# Patient Record
Sex: Female | Born: 1995 | Race: White | Hispanic: No | Marital: Married | Smoking: Never smoker
Health system: Southern US, Community
[De-identification: ages and names within clinical notes are randomized; demographics above are authoritative.]

## PROBLEM LIST (undated history)

## (undated) ENCOUNTER — Inpatient Hospital Stay (HOSPITAL_COMMUNITY): Payer: Self-pay

## (undated) DIAGNOSIS — F32A Depression, unspecified: Secondary | ICD-10-CM

## (undated) DIAGNOSIS — R55 Syncope and collapse: Secondary | ICD-10-CM

## (undated) DIAGNOSIS — N10 Acute tubulo-interstitial nephritis: Secondary | ICD-10-CM

## (undated) DIAGNOSIS — F419 Anxiety disorder, unspecified: Secondary | ICD-10-CM

## (undated) DIAGNOSIS — F329 Major depressive disorder, single episode, unspecified: Secondary | ICD-10-CM

## (undated) HISTORY — DX: Major depressive disorder, single episode, unspecified: F32.9

## (undated) HISTORY — DX: Depression, unspecified: F32.A

## (undated) HISTORY — DX: Anxiety disorder, unspecified: F41.9

## (undated) HISTORY — PX: FRACTURE SURGERY: SHX138

---

## 2017-10-31 ENCOUNTER — Emergency Department (HOSPITAL_COMMUNITY)
Admission: EM | Admit: 2017-10-31 | Discharge: 2017-10-31 | Disposition: A | Discharge status: Home / Self Care | Payer: Self-pay | Attending: Emergency Medicine | Admitting: Emergency Medicine

## 2017-10-31 ENCOUNTER — Encounter (HOSPITAL_COMMUNITY): Payer: Self-pay | Admitting: Emergency Medicine

## 2017-10-31 DIAGNOSIS — R110 Nausea: Secondary | ICD-10-CM | POA: Insufficient documentation

## 2017-10-31 DIAGNOSIS — O26891 Other specified pregnancy related conditions, first trimester: Secondary | ICD-10-CM | POA: Insufficient documentation

## 2017-10-31 DIAGNOSIS — R109 Unspecified abdominal pain: Secondary | ICD-10-CM | POA: Insufficient documentation

## 2017-10-31 DIAGNOSIS — Z3A01 Less than 8 weeks gestation of pregnancy: Secondary | ICD-10-CM | POA: Insufficient documentation

## 2017-10-31 LAB — URINALYSIS, ROUTINE W REFLEX MICROSCOPIC
Bilirubin Urine: NEGATIVE
GLUCOSE, UA: NEGATIVE mg/dL
HGB URINE DIPSTICK: NEGATIVE
Ketones, ur: 20 mg/dL — AB
LEUKOCYTES UA: NEGATIVE
Nitrite: NEGATIVE
PH: 5 (ref 5.0–8.0)
PROTEIN: NEGATIVE mg/dL
SPECIFIC GRAVITY, URINE: 1.016 (ref 1.005–1.030)

## 2017-10-31 LAB — COMPREHENSIVE METABOLIC PANEL
ALT: 10 U/L — AB (ref 14–54)
AST: 15 U/L (ref 15–41)
Albumin: 3.8 g/dL (ref 3.5–5.0)
Alkaline Phosphatase: 53 U/L (ref 38–126)
Anion gap: 6 (ref 5–15)
BUN: 8 mg/dL (ref 6–20)
CHLORIDE: 107 mmol/L (ref 101–111)
CO2: 24 mmol/L (ref 22–32)
CREATININE: 0.57 mg/dL (ref 0.44–1.00)
Calcium: 8.7 mg/dL — ABNORMAL LOW (ref 8.9–10.3)
GFR calc non Af Amer: >60 mL/min (ref >60)
Glucose, Bld: 85 mg/dL (ref 65–99)
POTASSIUM: 3.2 mmol/L — AB (ref 3.5–5.1)
SODIUM: 137 mmol/L (ref 135–145)
Total Bilirubin: 0.7 mg/dL (ref 0.3–1.2)
Total Protein: 6.5 g/dL (ref 6.5–8.1)

## 2017-10-31 LAB — I-STAT BETA HCG BLOOD, ED (MC, WL, AP ONLY): I-stat hCG, quantitative: 245.6 m[IU]/mL — ABNORMAL HIGH (ref <5)

## 2017-10-31 LAB — CBC
HCT: 31.6 % — ABNORMAL LOW (ref 36.0–46.0)
Hemoglobin: 10.6 g/dL — ABNORMAL LOW (ref 12.0–15.0)
MCH: 28.3 pg (ref 26.0–34.0)
MCHC: 33.5 g/dL (ref 30.0–36.0)
MCV: 84.5 fL (ref 78.0–100.0)
PLATELETS: 254 10*3/uL (ref 150–400)
RBC: 3.74 MIL/uL — AB (ref 3.87–5.11)
RDW: 12.1 % (ref 11.5–15.5)
WBC: 7.4 10*3/uL (ref 4.0–10.5)

## 2017-10-31 LAB — LIPASE, BLOOD: LIPASE: 23 U/L (ref 11–51)

## 2017-10-31 MED ORDER — PRENATAL VITAMIN 27-0.8 MG PO TABS: 1.0000 | ORAL_TABLET | Freq: Every day | ORAL | 0 refills | Status: DC

## 2017-10-31 MED ORDER — ONDANSETRON 4 MG PO TBDP: 4.0000 mg | ORAL_TABLET | Freq: Three times a day (TID) | ORAL | 0 refills | Status: DC | PRN

## 2017-10-31 NOTE — ED Notes (Signed)
Unable to obtain signature, signature pad does not work.

## 2017-10-31 NOTE — ED Notes (Signed)
Pt had drawn for labs:  Gold Red  Blue Lavender Lt green dk green

## 2017-10-31 NOTE — ED Triage Notes (Signed)
Patient c/o abd pain, lower back pain that has been going on for couple days with nausea. Today patient reports that she vomited once with darker blood in it.

## 2017-10-31 NOTE — ED Notes (Signed)
Pt verbalizes understanding of d/c paperwork, follow up instructions, and medications. Pt A/O x4, ambulatory. All belongings with patient upon departure.  

## 2017-10-31 NOTE — ED Provider Notes (Signed)
[facility]-EMERGENCY DEPT Provider Note   CSN: [id number] Arrival date & time: 10/31/17  1707     History   Chief Complaint Chief Complaint  Patient presents with  . Abdominal Pain  . Back Pain  . Hematemesis    HPI Angela Hardin is a 21 y.o. female.  Pt presents to ED today with abdominal pain and lower back pain with nausea.  The pt said that she felt this way when she was pregnant with her son.  The pt has felt very emotional too.  The pt denies any pain now.      Past Medical History:  Diagnosis Date  . Renal disorder     There are no active problems to display for this patient.   Past Surgical History:  Procedure Laterality Date  . FRACTURE SURGERY      OB History    No data available       Home Medications    Prior to Admission medications   Medication Sig Start Date End Date Taking? Authorizing Provider  ondansetron (ZOFRAN ODT) 4 MG disintegrating tablet Take 1 tablet (4 mg total) by mouth every 8 (eight) hours as needed. 10/31/17   Jacalyn LefevreHaviland, Junie Engram, MD  Prenatal Vit-Fe Fumarate-FA (PRENATAL VITAMIN) 27-0.8 MG TABS Take 1 capsule by mouth daily. 10/31/17   Jacalyn LefevreHaviland, Donnarae Rae, MD    Family History No family history on file.  Social History Social History   Tobacco Use  . Smoking status: Not on file  . Smokeless tobacco: Never Used  Substance Use Topics  . Alcohol use: Not on file  . Drug use: Not on file     Allergies   Patient has no known allergies.   Review of Systems Review of Systems  Gastrointestinal: Positive for abdominal pain and nausea.  All other systems reviewed and are negative.    Physical Exam Updated Vital Signs BP 117/64 (BP Location: Left Arm)   Pulse 95   Temp 98.3 F (36.8 C) (Oral)   Resp 18   Ht 5\' 5"  (1.651 m)   Wt 47.6 kg (105 lb)   LMP  (LMP Unknown)   SpO2 100%   BMI 17.47 kg/m   Physical Exam  Constitutional: She is oriented to person, place, and time. She appears  well-developed and well-nourished.  HENT:  Head: Normocephalic and atraumatic.  Mouth/Throat: Oropharynx is clear and moist.  Eyes: EOM are normal. Pupils are equal, round, and reactive to light.  Cardiovascular: Normal rate, regular rhythm, normal heart sounds and intact distal pulses.  Pulmonary/Chest: Effort normal and breath sounds normal.  Abdominal: Normal appearance and bowel sounds are normal. There is no tenderness.  Neurological: She is alert and oriented to person, place, and time.  Skin: Skin is warm. Capillary refill takes less than 2 seconds.  Psychiatric: She has a normal mood and affect.  Nursing note and vitals reviewed.    ED Treatments / Results  Labs (all labs ordered are listed, but only abnormal results are displayed) Labs Reviewed  COMPREHENSIVE METABOLIC PANEL - Abnormal; Notable for the following components:      Result Value   Potassium 3.2 (*)    Calcium 8.7 (*)    ALT 10 (*)    All other components within normal limits  CBC - Abnormal; Notable for the following components:   RBC 3.74 (*)    Hemoglobin 10.6 (*)    HCT 31.6 (*)    All other components within normal limits  URINALYSIS, ROUTINE  W REFLEX MICROSCOPIC - Abnormal; Notable for the following components:   Ketones, ur 20 (*)    All other components within normal limits  I-STAT BETA HCG BLOOD, ED ([facility], [facility], [facility] ONLY) - Abnormal; Notable for the following components:   I-stat hCG, quantitative 245.6 (*)    All other components within normal limits  LIPASE, BLOOD    EKG  EKG Interpretation None       Radiology No results found.  Procedures Procedures (including critical care time)  Medications Ordered in ED Medications - No data to display   Initial Impression / Assessment and Plan / ED Course  I have reviewed the triage vital signs and the nursing notes.  Pertinent labs & imaging results that were available during my care of the patient were reviewed by me and considered in my  medical decision making (see chart for details).    Preg test +.  Labs otherwise ok.  No current abd pain.  Pt instructed to f/u with women's clinic and return if worse.  Final Clinical Impressions(s) / ED Diagnoses   Final diagnoses:  Less than [redacted] weeks gestation of pregnancy    ED Discharge Orders        Ordered    ondansetron (ZOFRAN ODT) 4 MG disintegrating tablet  Every 8 hours PRN     10/31/17 2124    Prenatal Vit-Fe Fumarate-FA (PRENATAL VITAMIN) 27-0.8 MG TABS  Daily     10/31/17 2124       Jacalyn LefevreHaviland, Khoury Siemon, MD 10/31/17 2128

## 2017-11-24 DIAGNOSIS — Z3201 Encounter for pregnancy test, result positive: Secondary | ICD-10-CM | POA: Insufficient documentation

## 2017-11-24 DIAGNOSIS — Z79899 Other long term (current) drug therapy: Secondary | ICD-10-CM | POA: Insufficient documentation

## 2017-11-24 DIAGNOSIS — R102 Pelvic and perineal pain: Secondary | ICD-10-CM | POA: Insufficient documentation

## 2017-11-25 ENCOUNTER — Encounter (HOSPITAL_COMMUNITY): Payer: Self-pay | Admitting: Emergency Medicine

## 2017-11-25 ENCOUNTER — Emergency Department (HOSPITAL_COMMUNITY): Payer: Self-pay

## 2017-11-25 ENCOUNTER — Emergency Department (HOSPITAL_COMMUNITY)
Admission: EM | Admit: 2017-11-25 | Discharge: 2017-11-25 | Disposition: A | Discharge status: Home / Self Care | Payer: Self-pay | Attending: Emergency Medicine | Admitting: Emergency Medicine

## 2017-11-25 DIAGNOSIS — Z3490 Encounter for supervision of normal pregnancy, unspecified, unspecified trimester: Secondary | ICD-10-CM

## 2017-11-25 DIAGNOSIS — R102 Pelvic and perineal pain: Secondary | ICD-10-CM

## 2017-11-25 LAB — COMPREHENSIVE METABOLIC PANEL
ALBUMIN: 4.1 g/dL (ref 3.5–5.0)
ALK PHOS: 57 U/L (ref 38–126)
ALT: 9 U/L — ABNORMAL LOW (ref 14–54)
ANION GAP: 6 (ref 5–15)
AST: 15 U/L (ref 15–41)
BUN: 9 mg/dL (ref 6–20)
CALCIUM: 8.9 mg/dL (ref 8.9–10.3)
CO2: 22 mmol/L (ref 22–32)
Chloride: 109 mmol/L (ref 101–111)
Creatinine, Ser: 0.55 mg/dL (ref 0.44–1.00)
GFR calc non Af Amer: >60 mL/min (ref >60)
GLUCOSE: 87 mg/dL (ref 65–99)
Potassium: 3.6 mmol/L (ref 3.5–5.1)
Sodium: 137 mmol/L (ref 135–145)
Total Bilirubin: 0.6 mg/dL (ref 0.3–1.2)
Total Protein: 6.7 g/dL (ref 6.5–8.1)

## 2017-11-25 LAB — URINALYSIS, ROUTINE W REFLEX MICROSCOPIC
Bilirubin Urine: NEGATIVE
Glucose, UA: NEGATIVE mg/dL
Hgb urine dipstick: NEGATIVE
KETONES UR: NEGATIVE mg/dL
LEUKOCYTES UA: NEGATIVE
NITRITE: NEGATIVE
PROTEIN: NEGATIVE mg/dL
Specific Gravity, Urine: 1.016 (ref 1.005–1.030)
pH: 6 (ref 5.0–8.0)

## 2017-11-25 LAB — CBC
HEMATOCRIT: 35.4 % — AB (ref 36.0–46.0)
HEMOGLOBIN: 11.7 g/dL — AB (ref 12.0–15.0)
MCH: 28.1 pg (ref 26.0–34.0)
MCHC: 33.1 g/dL (ref 30.0–36.0)
MCV: 85.1 fL (ref 78.0–100.0)
PLATELETS: 201 10*3/uL (ref 150–400)
RBC: 4.16 MIL/uL (ref 3.87–5.11)
RDW: 12.8 % (ref 11.5–15.5)
WBC: 6.6 10*3/uL (ref 4.0–10.5)

## 2017-11-25 LAB — HCG, QUANTITATIVE, PREGNANCY: hCG, Beta Chain, Quant, S: 8033 m[IU]/mL — ABNORMAL HIGH (ref <5)

## 2017-11-25 LAB — LIPASE, BLOOD: LIPASE: 26 U/L (ref 11–51)

## 2017-11-25 LAB — ABO/RH: ABO/RH(D): O POS

## 2017-11-25 MED ORDER — ACETAMINOPHEN 325 MG PO TABS
650.0000 mg | ORAL_TABLET | Freq: Once | ORAL | Status: AC
  Administered 2017-11-25: 650 mg via ORAL
  Filled 2017-11-25: qty 2

## 2017-11-25 NOTE — ED Provider Notes (Signed)
[facility]-EMERGENCY DEPT Provider Note   CSN: [id number] Arrival date & time: 11/24/17  2249     History   Chief Complaint Chief Complaint  Patient presents with  . Abdominal Pain    HPI Angela Hardin is a 22 y.o. female.  HPI   [name] is a 22yo G2P1 female with no significant past medical history who presents to the Emergency Department for evaluation of cramping. Patient states that she has not seen OB/GYN, but per her calculations from her LMP she is about [redacted]wks pregnant. Reports that she has had some lower abdominal cramping for the past few weeks and this is normal for her as she had lower abdominal cramping with her first pregnancy during the first trimester. Yesterday afternoon she states that the cramping became persistent, which was unusual. States that it feels like a dull ache in the suprapubic area which is constant and 3/10 in severity. Every once in a while the pain becomes a 6/10 and "sharp" without any known trigger. She tried taking tylenol without relief of symptoms. She has some cramping lower back pain as well. She denies fever, vaginal bleeding, vaginal discharge, nausea/vomiting, diarrhea, dysuria, hematuria, shortness of breath, chest pain. Denies complications in her first pregnancy.   Past Medical History:  Diagnosis Date  . Renal disorder     There are no active problems to display for this patient.   Past Surgical History:  Procedure Laterality Date  . FRACTURE SURGERY      OB History    Gravida Para Term Preterm AB Living   1             SAB TAB Ectopic Multiple Live Births                   Home Medications    Prior to Admission medications   Medication Sig Start Date End Date Taking? Authorizing Provider  ondansetron (ZOFRAN ODT) 4 MG disintegrating tablet Take 1 tablet (4 mg total) by mouth every 8 (eight) hours as needed. 10/31/17   Jacalyn LefevreHaviland, Julie, MD  Prenatal Vit-Fe Fumarate-FA (PRENATAL VITAMIN) 27-0.8 MG  TABS Take 1 capsule by mouth daily. 10/31/17   Jacalyn LefevreHaviland, Julie, MD    Family History No family history on file.  Social History Social History   Tobacco Use  . Smoking status: Never Smoker  . Smokeless tobacco: Never Used  Substance Use Topics  . Alcohol use: Not on file  . Drug use: Not on file     Allergies   Patient has no known allergies.   Review of Systems Review of Systems  Constitutional: Negative for chills, fatigue and fever.  Eyes: Negative for visual disturbance.  Respiratory: Negative for shortness of breath.   Cardiovascular: Negative for chest pain.  Gastrointestinal: Positive for abdominal pain. Negative for diarrhea, nausea and vomiting.  Genitourinary: Negative for difficulty urinating, dysuria, flank pain, hematuria and vaginal bleeding.  Musculoskeletal: Positive for back pain. Negative for gait problem.  Skin: Negative for rash.  Neurological: Negative for dizziness, light-headedness and headaches.  Psychiatric/Behavioral: Negative for agitation.     Physical Exam Updated Vital Signs BP 104/63   Pulse 75   Temp 98.5 F (36.9 C) (Oral)   Resp 15   LMP  (LMP Unknown)   SpO2 100%   Physical Exam  Constitutional: She appears well-developed and well-nourished. No distress.  Laying comfortably at the bedside in no apparent distress.   HENT:  Head: Normocephalic and atraumatic.  Mouth/Throat: Oropharynx  is clear and moist. No oropharyngeal exudate.  Eyes: Pupils are equal, round, and reactive to light. Right eye exhibits no discharge. Left eye exhibits no discharge.  Neck: Normal range of motion. Neck supple.  Cardiovascular: Normal rate and regular rhythm.  No murmur heard. Pulmonary/Chest: Effort normal and breath sounds normal. No stridor. No respiratory distress. She has no wheezes. She has no rhonchi. She has no rales.  Abdominal: Normal appearance and bowel sounds are normal. She exhibits no distension and no mass. There is no CVA  tenderness, no tenderness at McBurney's point and negative Murphy's sign.  Mild tenderness to palpation in the suprapubic area. No guarding or rigidity. No rebound tenderness.   Musculoskeletal: Normal range of motion.  Neurological: She is alert. Coordination normal.  Skin: Skin is warm and dry. Capillary refill takes less than 2 seconds. She is not diaphoretic.  Psychiatric: She has a normal mood and affect. Her behavior is normal.  Nursing note and vitals reviewed.    ED Treatments / Results  Labs (all labs ordered are listed, but only abnormal results are displayed) Labs Reviewed  COMPREHENSIVE METABOLIC PANEL - Abnormal; Notable for the following components:      Result Value   ALT 9 (*)    All other components within normal limits  CBC - Abnormal; Notable for the following components:   Hemoglobin 11.7 (*)    HCT 35.4 (*)    All other components within normal limits  HCG, QUANTITATIVE, PREGNANCY - Abnormal; Notable for the following components:   hCG, Beta Chain, Quant, S 8,033 (*)    All other components within normal limits  LIPASE, BLOOD  URINALYSIS, ROUTINE W REFLEX MICROSCOPIC    EKG  EKG Interpretation None       Radiology No results found.  Procedures Procedures (including critical care time)  Medications Ordered in ED Medications - No data to display   Initial Impression / Assessment and Plan / ED Course  I have reviewed the triage vital signs and the nursing notes.  Pertinent labs & imaging results that were available during my care of the patient were reviewed by me and considered in my medical decision making (see chart for details).     Presents with abdominal cramping. Reports being [redacted]wks pregnant. Denies vaginal bleeding. She is afebrile and non-toxic appearing. VSS. Has mild tenderness in the suprapubic area, no guarding or rigidity. No rebound tenderness.   Beta hCG 8,033. CBC, CMP, UA, Lipase negative. She is Rh+.   Transvaginal US  ordered to evaluate for ectopic pregnancy. US reveals probable early intrauterine gestational sac and yolk sac. No fetal pole or cardiac activity. Recommend follow up BhCG and [place] in 14 days.   On recheck she states her cramping is mild, no peritoneal signs on exam. Informed patient of the [place] results and answered questions at the bedside. Counseled her to follow up at [facility] for follow up of her results today and for further pre-natal care. Have counseled her to take tylenol at home for any further pain. Discussed return precautions. Patient agrees and voices understanding to the above plan. She has no complaints prior to discharge.   Final Clinical Impressions(s) / ED Diagnoses   Final diagnoses:  Pregnancy, unspecified gestational age    ED Discharge Orders    None       Lawrence Marseilles 11/25/17 1610    Dione Booze, MD 11/25/17 2245

## 2017-11-25 NOTE — Discharge Instructions (Signed)
Please take tylenol for stomach cramping  Schedule an appointment and follow up with [facility] for further blood work and ultrasound.   I have printed the ultrasound results.   Return to the ER if you have worsening stomach pain with fever, stomach pain with vaginal bleeding or have any new or worsening symptoms.

## 2017-11-25 NOTE — ED Triage Notes (Signed)
Patient here with complaints of lower abdominal pain described at cramps that started today. Reports being [redacted] weeks pregnant. Concerned about miscarriage. Denies bleeding.

## 2017-12-02 ENCOUNTER — Encounter (HOSPITAL_COMMUNITY): Payer: Self-pay

## 2017-12-02 ENCOUNTER — Inpatient Hospital Stay (HOSPITAL_COMMUNITY): Payer: Self-pay

## 2017-12-02 ENCOUNTER — Inpatient Hospital Stay (HOSPITAL_COMMUNITY)
Admission: AD | Admit: 2017-12-02 | Discharge: 2017-12-02 | Disposition: A | Discharge status: Home / Self Care | Payer: Self-pay | Source: Ambulatory Visit | Attending: Obstetrics and Gynecology | Admitting: Obstetrics and Gynecology

## 2017-12-02 DIAGNOSIS — O039 Complete or unspecified spontaneous abortion without complication: Secondary | ICD-10-CM

## 2017-12-02 DIAGNOSIS — O209 Hemorrhage in early pregnancy, unspecified: Secondary | ICD-10-CM

## 2017-12-02 DIAGNOSIS — Z3A01 Less than 8 weeks gestation of pregnancy: Secondary | ICD-10-CM | POA: Insufficient documentation

## 2017-12-02 LAB — CBC
HCT: 35.1 % — ABNORMAL LOW (ref 36.0–46.0)
HEMOGLOBIN: 11.8 g/dL — AB (ref 12.0–15.0)
MCH: 28.6 pg (ref 26.0–34.0)
MCHC: 33.6 g/dL (ref 30.0–36.0)
MCV: 85 fL (ref 78.0–100.0)
Platelets: 204 10*3/uL (ref 150–400)
RBC: 4.13 MIL/uL (ref 3.87–5.11)
RDW: 13 % (ref 11.5–15.5)
WBC: 6.4 10*3/uL (ref 4.0–10.5)

## 2017-12-02 LAB — URINALYSIS, ROUTINE W REFLEX MICROSCOPIC
Bilirubin Urine: NEGATIVE
Glucose, UA: NEGATIVE mg/dL
Ketones, ur: NEGATIVE mg/dL
Leukocytes, UA: NEGATIVE
Nitrite: NEGATIVE
PROTEIN: NEGATIVE mg/dL
SPECIFIC GRAVITY, URINE: 1.011 (ref 1.005–1.030)
pH: 6 (ref 5.0–8.0)

## 2017-12-02 LAB — HCG, QUANTITATIVE, PREGNANCY: hCG, Beta Chain, Quant, S: 4710 m[IU]/mL — ABNORMAL HIGH (ref <5)

## 2017-12-02 NOTE — MAU Provider Note (Signed)
History   G2P1001 @ 7.2 wks in with vaginal bleeding and cramping  that started this morning.  CSN: [id number]  Arrival date & time 12/02/17  1318   None     Chief Complaint  Patient presents with  . Vaginal Bleeding  . Shoulder Pain    HPI  Past Medical History:  Diagnosis Date  . Renal disorder    during 1st pregnancy. tx with abx    Past Surgical History:  Procedure Laterality Date  . FRACTURE SURGERY      Family History  Problem Relation Age of Onset  . Hyperlipidemia Mother   . Heart disease Maternal Grandfather     Social History   Tobacco Use  . Smoking status: Never Smoker  . Smokeless tobacco: Never Used  Substance Use Topics  . Alcohol use: No    Frequency: Never    Comment: social  . Drug use: No    OB History    Gravida Para Term Preterm AB Living   2 1 1     1    SAB TAB Ectopic Multiple Live Births                  Review of Systems  Constitutional: Negative.   HENT: Negative.   Eyes: Negative.   Respiratory: Negative.   Cardiovascular: Negative.   Gastrointestinal: Positive for abdominal pain.  Endocrine: Negative.   Genitourinary: Positive for vaginal bleeding.  Musculoskeletal: Positive for back pain.  Skin: Negative.   Neurological: Negative.   Hematological: Negative.   Psychiatric/Behavioral: Negative.     Allergies  Patient has no known allergies.  Home Medications    BP 114/65 (BP Location: Right Arm)   Pulse 95   Resp 16   Ht 5\' 5"  (1.651 m)   Wt 116 lb (52.6 kg)   LMP  (LMP Unknown)   BMI 19.30 kg/m   Physical Exam  Constitutional: She is oriented to person, place, and time. She appears well-developed and well-nourished.  HENT:  Head: Normocephalic.  Eyes: Pupils are equal, round, and reactive to light.  Neck: Normal range of motion.  Cardiovascular: Normal rate, regular rhythm, normal heart sounds and intact distal pulses.  Pulmonary/Chest: Effort normal and breath sounds normal.  Abdominal: Soft.  Bowel sounds are normal.  Genitourinary: Vagina normal and uterus normal.  Musculoskeletal: Normal range of motion.  Neurological: She is alert and oriented to person, place, and time. She has normal reflexes.  Skin: Skin is warm and dry.  Psychiatric: She has a normal mood and affect. Her behavior is normal. Judgment and thought content normal.    MAU Course  Procedures (including critical care time)  Labs Reviewed  URINALYSIS, ROUTINE W REFLEX MICROSCOPIC - Abnormal; Notable for the following components:      Result Value   APPearance HAZY (*)    Hgb urine dipstick MODERATE (*)    Bacteria, UA RARE (*)    Squamous Epithelial / LPF 0-5 (*)    All other components within normal limits  CBC - Abnormal; Notable for the following components:   Hemoglobin 11.8 (*)    HCT 35.1 (*)    All other components within normal limits  HCG, QUANTITATIVE, PREGNANCY   Results for orders placed or performed during the hospital encounter of 12/02/17 (from the past 24 hour(s))  Urinalysis, Routine w reflex microscopic     Status: Abnormal   Collection Time: 12/02/17  1:45 PM  Result Value Ref Range   Color, Urine YELLOW  YELLOW   APPearance HAZY (A) CLEAR   Specific Gravity, Urine 1.011 1.005 - 1.030   pH 6.0 5.0 - 8.0   Glucose, UA NEGATIVE NEGATIVE mg/dL   Hgb urine dipstick MODERATE (A) NEGATIVE   Bilirubin Urine NEGATIVE NEGATIVE   Ketones, ur NEGATIVE NEGATIVE mg/dL   Protein, ur NEGATIVE NEGATIVE mg/dL   Nitrite NEGATIVE NEGATIVE   Leukocytes, UA NEGATIVE NEGATIVE   RBC / HPF 0-5 0 - 5 RBC/hpf   WBC, UA 0-5 0 - 5 WBC/hpf   Bacteria, UA RARE (A) NONE SEEN   Squamous Epithelial / LPF 0-5 (A) NONE SEEN   Mucus PRESENT   CBC     Status: Abnormal   Collection Time: 12/02/17  2:07 PM  Result Value Ref Range   WBC 6.4 4.0 - 10.5 K/uL   RBC 4.13 3.87 - 5.11 MIL/uL   Hemoglobin 11.8 (L) 12.0 - 15.0 g/dL   HCT 40.935.1 (L) 81.136.0 - 91.446.0 %   MCV 85.0 78.0 - 100.0 fL   MCH 28.6 26.0 - 34.0 pg    MCHC 33.6 30.0 - 36.0 g/dL   RDW 78.213.0 95.611.5 - 21.315.5 %   Platelets 204 150 - 400 K/uL  hCG, quantitative, pregnancy     Status: Abnormal   Collection Time: 12/02/17  2:07 PM  Result Value Ref Range   hCG, Beta Chain, Quant, S 4,710 (H) <5 mIU/mL   No results found.   No diagnosis found.    MDM  VSS, sm amt vag bleeding at present. Quant down to 4710. Koreas shows collapsing yolk sac. Lengthy discussion with pt and partner regarding options for treatment. Pt ops to do nothing and get repeat quant next Saturday.

## 2017-12-02 NOTE — MAU Note (Signed)
Onset of vaginal bleeding this morning, wearing a pad, having right shoulder pain for a while, low back apin.

## 2017-12-02 NOTE — Discharge Instructions (Signed)

## 2017-12-02 NOTE — Progress Notes (Addendum)
G2P1 @ early preg [redacted] wksga. Here due to bleeding like a period when on toilet. Bleeding started 45 mins ago. Denies pain.   To U/S 1618: back from U/S  1652: d/c instructions given with pt understanding. Pt left unit with husband via ambulatory. D/c signature obtained on paper due to AVS signature pad not working.

## 2017-12-11 ENCOUNTER — Encounter: Payer: Self-pay | Admitting: Advanced Practice Midwife

## 2017-12-11 ENCOUNTER — Ambulatory Visit (HOSPITAL_COMMUNITY)
Admission: RE | Admit: 2017-12-11 | Discharge: 2017-12-11 | Disposition: A | Discharge status: Home / Self Care | Payer: Self-pay | Source: Ambulatory Visit | Attending: Advanced Practice Midwife | Admitting: Advanced Practice Midwife

## 2017-12-11 ENCOUNTER — Ambulatory Visit (INDEPENDENT_AMBULATORY_CARE_PROVIDER_SITE_OTHER): Payer: Self-pay | Admitting: Advanced Practice Midwife

## 2017-12-11 VITALS — BP 104/64 | HR 72 | Wt 116.2 lb

## 2017-12-11 DIAGNOSIS — O039 Complete or unspecified spontaneous abortion without complication: Secondary | ICD-10-CM

## 2017-12-11 DIAGNOSIS — O034 Incomplete spontaneous abortion without complication: Secondary | ICD-10-CM | POA: Insufficient documentation

## 2017-12-11 MED ORDER — MISOPROSTOL 200 MCG PO TABS: 800.0000 ug | ORAL_TABLET | Freq: Once | ORAL | 0 refills | Status: DC

## 2017-12-11 MED ORDER — PROMETHAZINE HCL 25 MG PO TABS: 12.5000 mg | ORAL_TABLET | Freq: Four times a day (QID) | ORAL | 0 refills | Status: DC | PRN

## 2017-12-11 NOTE — Patient Instructions (Signed)

## 2017-12-11 NOTE — Progress Notes (Signed)
Pt states had a miscarriage on 12/02/17 and was told to still come in

## 2017-12-11 NOTE — Progress Notes (Signed)
Subjective:     Patient ID: Angela Hardin, female   DOB: 04/29/1996, 22 y.o.   MRN: [id number]  Angela Hardin is a 22 y.o. G2P1001 at 6681w4d who presents for SAB follow up. She was seen in MAU on 12/02/16. She states that she had very heavy bleeding after her visit. She reports that the heaviest bleeding was on 12/08/17, and she passed several large clots that day. She states that now her bleeding is like a normal period, and much lighter. Cramping has also diminished.    Vaginal Bleeding  The patient's primary symptoms include pelvic pain and vaginal bleeding. This is a new problem. The current episode started in the past 7 days. The problem occurs intermittently. The problem has been gradually improving. The patient is experiencing no pain. She is pregnant. Pertinent negatives include no chills, fever, nausea or vomiting. The vaginal discharge was bloody. The vaginal bleeding is typical of menses. She has been passing clots (last week, none today ). She has been passing tissue (possible last week, but none today ).     Review of Systems  Constitutional: Negative for chills and fever.  Gastrointestinal: Negative for nausea and vomiting.  Genitourinary: Positive for pelvic pain and vaginal bleeding.       Objective:   Physical Exam  Constitutional: She is oriented to person, place, and time. She appears well-developed and well-nourished. No distress.  HENT:  Head: Normocephalic.  Cardiovascular: Normal rate.  Pulmonary/Chest: Effort normal.  Abdominal: Soft. There is no tenderness. There is no rebound.  Neurological: She is alert and oriented to person, place, and time.  Skin: Skin is warm and dry.  Psychiatric: She has a normal mood and affect.  Nursing note and vitals reviewed.      Assessment:     1. Spontaneous abortion        Plan:     CBC, HCG, OB US transvaginal today If SAB confirmed, then needs to return in about 2 weeks for HCG check.      Thressa ShellerHeather Shone Leventhal 9:09 AM  12/11/17   10:21 AM 12/11/17 Addendum: Koreas Ob Transvaginal  Result Date: 12/11/2017 CLINICAL DATA:  Spontaneous abortion, viability EXAM: TRANSVAGINAL OB ULTRASOUND TECHNIQUE: Transvaginal ultrasound was performed for complete evaluation of the gestation as well as the maternal uterus, adnexal regions, and pelvic cul-de-sac. COMPARISON:  12/02/2017 FINDINGS: Intrauterine gestational sac: Irregular gestational sac in the region of the cervix Yolk sac:  Possibly visualized Embryo:  Not visualized Cardiac Activity: Not visualized Heart Rate:  bpm MSD: 7.8 mm   5 w   3 d CRL:     mm    w  d                  US EDC: Subchorionic hemorrhage:  None visualized. Maternal uterus/adnexae: No adnexal masses or free fluid. IMPRESSION: Irregular gestational sac, now located in the region of the cervix. Estimated gestational age [redacted] weeks 3 days compared with 6 weeks the previously. Findings compatible with failed pregnancy and spontaneous abortion in progress. Electronically Signed   By: Charlett NoseKevin  Dover M.D.   On: 12/11/2017 10:08   Still tissue present at this time. TC to patient, and rx cytotec 800mcg buccally x1, and may repeat in 24-48 hours if does not pass tissue. Phenergan PRN #30. Bleeding precautions reviewed. FU in 2 weeks.  Message sent via mychart for a work note for the patient. Excuse from work 1/24, 1/25, 1/26.   Thressa ShellerHeather Geneive Sandstrom 12:36 PM 12/11/17

## 2017-12-12 LAB — CBC
HEMATOCRIT: 36.3 % (ref 34.0–46.6)
HEMOGLOBIN: 11.8 g/dL (ref 11.1–15.9)
MCH: 28 pg (ref 26.6–33.0)
MCHC: 32.5 g/dL (ref 31.5–35.7)
MCV: 86 fL (ref 79–97)
Platelets: 214 10*3/uL (ref 150–379)
RBC: 4.22 x10E6/uL (ref 3.77–5.28)
RDW: 13.5 % (ref 12.3–15.4)
WBC: 8.1 10*3/uL (ref 3.4–10.8)

## 2017-12-12 LAB — BETA HCG QUANT (REF LAB): hCG Quant: 178 m[IU]/mL

## 2017-12-25 ENCOUNTER — Telehealth: Payer: Self-pay | Admitting: *Deleted

## 2017-12-25 ENCOUNTER — Ambulatory Visit: Payer: Self-pay

## 2017-12-25 ENCOUNTER — Encounter: Payer: Self-pay | Admitting: *Deleted

## 2017-12-25 NOTE — Telephone Encounter (Signed)
Koleen DistanceKailee DNKA for routing bhcg after miscarriage. I called Milinda PointerKailee and left a message she missed lab appt. Please call and reschedule. Will send my chart message and will send letter.

## 2018-02-20 ENCOUNTER — Encounter (HOSPITAL_COMMUNITY): Payer: Self-pay | Admitting: Emergency Medicine

## 2018-02-20 ENCOUNTER — Ambulatory Visit (HOSPITAL_COMMUNITY)
Admission: EM | Admit: 2018-02-20 | Discharge: 2018-02-20 | Disposition: A | Discharge status: Home / Self Care | Payer: Medicaid Other | Attending: Family Medicine | Admitting: Family Medicine

## 2018-02-20 DIAGNOSIS — M436 Torticollis: Secondary | ICD-10-CM

## 2018-02-20 MED ORDER — KETOROLAC TROMETHAMINE 60 MG/2ML IM SOLN
60.0000 mg | Freq: Once | INTRAMUSCULAR | Status: AC
  Administered 2018-02-20: 60 mg via INTRAMUSCULAR

## 2018-02-20 MED ORDER — CYCLOBENZAPRINE HCL 5 MG PO TABS: 5.0000 mg | ORAL_TABLET | Freq: Three times a day (TID) | ORAL | 0 refills | Status: DC | PRN

## 2018-02-20 MED ORDER — KETOROLAC TROMETHAMINE 60 MG/2ML IM SOLN
INTRAMUSCULAR | Status: AC
  Filled 2018-02-20: qty 2

## 2018-02-20 NOTE — ED Triage Notes (Signed)
Pt sts neck pain worse with movement x 3 days

## 2018-02-20 NOTE — ED Provider Notes (Signed)
[facility]    CSN: [id number] Arrival date & time: 02/20/18  1146     History   Chief Complaint Chief Complaint  Patient presents with  . Neck Pain    HPI Angela Hardin is a 22 y.o. female.   Angela Hardin presents with complaints of neck pain, worse to the left side, which started two nights ago while at work. 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 Without specific injury or trauma. Denies any previous similar. Without numbness or tingling to arms or hands. Took tylenol and aleve this morning which did not help as well as applied bengay and heat which have not helped. Pain is worse with movement of the neck. Without contributing medical history.    ,ROS per HPI.      Past Medical History:  Diagnosis Date  . Renal disorder    during 1st pregnancy. tx with abx    There are no active problems to display for this patient.   Past Surgical History:  Procedure Laterality Date  . FRACTURE SURGERY      OB History    Gravida  2   Para  1   Term  1   Preterm      AB      Living  1     SAB      TAB      Ectopic      Multiple      Live Births               Home Medications    Prior to Admission medications   Medication Sig Start Date End Date Taking? Authorizing Provider  acetaminophen (TYLENOL) 325 MG tablet Take 650 mg by mouth every 6 (six) hours as needed for mild pain.    [provider]  cyclobenzaprine (FLEXERIL) 5 MG tablet Take 1 tablet (5 mg total) by mouth 3 (three) times daily as needed for muscle spasms. 02/20/18   Georgetta Haber, NP  misoprostol (CYTOTEC) 200 MCG tablet Take 4 tablets (800 mcg total) by mouth once for 1 dose. May repeat in 24-48 hours if needed 12/11/17 12/11/17  Armando Reichert, CNM  promethazine (PHENERGAN) 25 MG tablet Take 0.5-1 tablets (12.5-25 mg total) by mouth every 6 (six) hours as needed for nausea or vomiting. 12/11/17   Armando Reichert, CNM    Family History Family  History  Problem Relation Age of Onset  . Hyperlipidemia Mother   . Heart disease Maternal Grandfather     Social History Social History   Tobacco Use  . Smoking status: Never Smoker  . Smokeless tobacco: Never Used  Substance Use Topics  . Alcohol use: No    Frequency: Never    Comment: social  . Drug use: No     Allergies   Patient has no known allergies.   Review of Systems Review of Systems   Physical Exam Triage Vital Signs ED Triage Vitals [02/20/18 1210]  Enc Vitals Group     BP 114/72     Pulse Rate 80     Resp 18     Temp 98.7 F (37.1 C)     Temp Source Oral     SpO2 100 %     Weight      Height      Head Circumference      Peak Flow      Pain Score      Pain Loc  Pain Edu?      Excl. in GC?    No data found.  Updated Vital Signs BP 114/72 (BP Location: Right Arm)   Pulse 80   Temp 98.7 F (37.1 C) (Oral)   Resp 18   LMP  (LMP Unknown)   SpO2 100%   Breastfeeding? Unknown   Visual Acuity Right Eye Distance:   Left Eye Distance:   Bilateral Distance:    Right Eye Near:   Left Eye Near:    Bilateral Near:     Physical Exam  Constitutional: She is oriented to person, place, and time. She appears well-developed and well-nourished. No distress.  Cardiovascular: Normal rate, regular rhythm and normal heart sounds.  Pulmonary/Chest: Effort normal and breath sounds normal.  Musculoskeletal:       Cervical back: She exhibits tenderness, pain and spasm. She exhibits no bony tenderness, no swelling, no edema and normal pulse.       Back:  Full ROM to shoulders; upper extremities with sensation intact; strong radial pulses bilaterally; pain with ROM to neck, very limited rotation to left as pain is more severe; pain with chin to chest as mild pain with chin up in neck extension; tenderness at left neck musculature/ trapezius   Neurological: She is alert and oriented to person, place, and time.  Skin: Skin is warm and dry.     UC  Treatments / Results  Labs (all labs ordered are listed, but only abnormal results are displayed) Labs Reviewed - No data to display  EKG None Radiology No results found.  Procedures Procedures (including critical care time)  Medications Ordered in UC Medications  ketorolac (TORADOL) injection 60 mg (60 mg Intramuscular Given 02/20/18 1230)     Initial Impression / Assessment and Plan / UC Course  I have reviewed the triage vital signs and the nursing notes.  Pertinent labs & imaging results that were available during my care of the patient were reviewed by me and considered in my medical decision making (see chart for details).    toradol provided in clinic and flexeril provided. Safety precautions provided. Heat, massage, rom as able. Patient verbalized understanding and agreeable to plan.    Final Clinical Impressions(s) / UC Diagnoses   Final diagnoses:  Torticollis, acute    ED Discharge Orders        Ordered    cyclobenzaprine (FLEXERIL) 5 MG tablet  3 times daily PRN     02/20/18 1229       Controlled Substance Prescriptions [place] Controlled Substance Registry consulted? Not Applicable   Georgetta HaberBurky, Joesiah Lonon B, NP 02/20/18 1252

## 2018-02-20 NOTE — Discharge Instructions (Signed)
Do not take additional ibuprofen or aleve tonight, may resume tomorrow. May take tylenol as needed. Light exercises, heat, massage Muscle relaxer's up to three times a day as needed, may cause drowsiness so do not take if driving or drinking alcohol.

## 2018-02-28 ENCOUNTER — Ambulatory Visit (INDEPENDENT_AMBULATORY_CARE_PROVIDER_SITE_OTHER): Payer: Medicaid Other

## 2018-02-28 DIAGNOSIS — Z3200 Encounter for pregnancy test, result unknown: Secondary | ICD-10-CM

## 2018-02-28 DIAGNOSIS — Z3201 Encounter for pregnancy test, result positive: Secondary | ICD-10-CM

## 2018-02-28 LAB — POCT URINE PREGNANCY: PREG TEST UR: POSITIVE — AB

## 2018-02-28 NOTE — Progress Notes (Signed)
..   [name] presents today for UPT. She has no unusual complaints. LMP: 01-28-18    OBJECTIVE: Appears well, in no apparent distress.  OB History    Gravida  2   Para  1   Term  1   Preterm      AB      Living  1     SAB      TAB      Ectopic      Multiple      Live Births             Home UPT Result:Positive In-Office UPT result:Positive I have reviewed the patient's medical, obstetrical, social, and family histories, and medications.   ASSESSMENT: Positive pregnancy test  PLAN Prenatal care to be completed at: [facility]

## 2018-03-02 ENCOUNTER — Encounter (HOSPITAL_COMMUNITY): Payer: Self-pay | Admitting: *Deleted

## 2018-03-02 ENCOUNTER — Inpatient Hospital Stay (HOSPITAL_COMMUNITY)
Admission: AD | Admit: 2018-03-02 | Discharge: 2018-03-02 | Disposition: A | Discharge status: Home / Self Care | Payer: Medicaid Other | Source: Ambulatory Visit | Attending: Obstetrics and Gynecology | Admitting: Obstetrics and Gynecology

## 2018-03-02 DIAGNOSIS — R55 Syncope and collapse: Secondary | ICD-10-CM | POA: Insufficient documentation

## 2018-03-02 DIAGNOSIS — O26891 Other specified pregnancy related conditions, first trimester: Secondary | ICD-10-CM | POA: Diagnosis not present

## 2018-03-02 DIAGNOSIS — O3680X0 Pregnancy with inconclusive fetal viability, not applicable or unspecified: Secondary | ICD-10-CM | POA: Diagnosis not present

## 2018-03-02 DIAGNOSIS — Z3A01 Less than 8 weeks gestation of pregnancy: Secondary | ICD-10-CM | POA: Insufficient documentation

## 2018-03-02 HISTORY — DX: Acute pyelonephritis: N10

## 2018-03-02 LAB — URINALYSIS, ROUTINE W REFLEX MICROSCOPIC
Bilirubin Urine: NEGATIVE
GLUCOSE, UA: NEGATIVE mg/dL
Hgb urine dipstick: NEGATIVE
Ketones, ur: NEGATIVE mg/dL
LEUKOCYTES UA: NEGATIVE
Nitrite: NEGATIVE
PH: 6 (ref 5.0–8.0)
PROTEIN: NEGATIVE mg/dL
Specific Gravity, Urine: 1.014 (ref 1.005–1.030)

## 2018-03-02 LAB — COMPREHENSIVE METABOLIC PANEL
ALK PHOS: 57 U/L (ref 38–126)
ALT: 12 U/L — AB (ref 14–54)
AST: 17 U/L (ref 15–41)
Albumin: 4.1 g/dL (ref 3.5–5.0)
Anion gap: 7 (ref 5–15)
BUN: 15 mg/dL (ref 6–20)
CALCIUM: 9.1 mg/dL (ref 8.9–10.3)
CO2: 25 mmol/L (ref 22–32)
CREATININE: 0.63 mg/dL (ref 0.44–1.00)
Chloride: 106 mmol/L (ref 101–111)
GFR calc non Af Amer: >60 mL/min (ref >60)
Glucose, Bld: 91 mg/dL (ref 65–99)
Potassium: 3.9 mmol/L (ref 3.5–5.1)
SODIUM: 138 mmol/L (ref 135–145)
Total Bilirubin: 0.3 mg/dL (ref 0.3–1.2)
Total Protein: 6.8 g/dL (ref 6.5–8.1)

## 2018-03-02 LAB — CBC
HCT: 37.3 % (ref 36.0–46.0)
HEMOGLOBIN: 12.6 g/dL (ref 12.0–15.0)
MCH: 28.3 pg (ref 26.0–34.0)
MCHC: 33.8 g/dL (ref 30.0–36.0)
MCV: 83.8 fL (ref 78.0–100.0)
Platelets: 222 10*3/uL (ref 150–400)
RBC: 4.45 MIL/uL (ref 3.87–5.11)
RDW: 12.1 % (ref 11.5–15.5)
WBC: 4.7 10*3/uL (ref 4.0–10.5)

## 2018-03-02 LAB — GLUCOSE, CAPILLARY: Glucose-Capillary: 71 mg/dL (ref 65–99)

## 2018-03-02 NOTE — MAU Provider Note (Signed)
History     CSN: [id number]  Arrival date and time: 03/02/18 1607   First Provider Initiated Contact with Patient 03/02/18 1908      Chief Complaint  Patient presents with  . Loss of Consciousness  . Emesis   G3P1011 @[redacted]w[redacted]d  here after syncope episode. She was taking a shower and became dizzy, when she woke there was vomit present. Her S.O. Thinks she was unconcious for 10-15 min. She does not remember. She had a similar episode 3-4 days ago. She reports having these episode since age 22 and worse with her first pregnancy. At times she feels "fuzzy" before one of these episodes. She reports having a workup some time ago that did not show anything. She is eating and drinking well.   OB History    Gravida  3   Para  1   Term  1   Preterm      AB  1   Living  1     SAB  1   TAB      Ectopic      Multiple      Live Births  1           Past Medical History:  Diagnosis Date  . Pyelonephritis, acute    during 1st pregnancy. tx with abx    Past Surgical History:  Procedure Laterality Date  . FRACTURE SURGERY     Elbow    Family History  Problem Relation Age of Onset  . Hyperlipidemia Mother   . Heart disease Maternal Grandfather     Social History   Tobacco Use  . Smoking status: Never Smoker  . Smokeless tobacco: Never Used  Substance Use Topics  . Alcohol use: No    Frequency: Never    Comment: social  . Drug use: No    Allergies: No Known Allergies  Medications Prior to Admission  Medication Sig Dispense Refill Last Dose  . acetaminophen (TYLENOL) 325 MG tablet Take 650 mg by mouth every 6 (six) hours as needed for mild pain.   Not Taking  . cyclobenzaprine (FLEXERIL) 5 MG tablet Take 1 tablet (5 mg total) by mouth 3 (three) times daily as needed for muscle spasms. (Patient not taking: Reported on 02/28/2018) 15 tablet 0 Not Taking  . misoprostol (CYTOTEC) 200 MCG tablet Take 4 tablets (800 mcg total) by mouth once for 1 dose. May repeat in  24-48 hours if needed 8 tablet 0   . promethazine (PHENERGAN) 25 MG tablet Take 0.5-1 tablets (12.5-25 mg total) by mouth every 6 (six) hours as needed for nausea or vomiting. (Patient not taking: Reported on 02/28/2018) 30 tablet 0 Not Taking    Review of Systems  Constitutional: Negative for fever.  Gastrointestinal: Negative for abdominal pain, constipation, diarrhea, nausea and vomiting.  Genitourinary: Negative for vaginal bleeding.  Neurological: Positive for syncope. Negative for seizures.   Physical Exam   Blood pressure 106/64, pulse 99, temperature 98.2 F (36.8 C), temperature source Oral, resp. rate 16, height 5\' 5"  (1.651 m), weight 115 lb (52.2 kg), last menstrual period 01/28/2018, unknown if currently breastfeeding.  Physical Exam  Nursing note and vitals reviewed. Constitutional: She is oriented to person, place, and time. She appears well-developed and well-nourished. No distress.  HENT:  Head: Normocephalic and atraumatic.  Neck: Normal range of motion.  Cardiovascular: Normal rate, regular rhythm and normal heart sounds.  Respiratory: Effort normal and breath sounds normal. No respiratory distress. She has no wheezes. She has  no rales.  GI: Soft. She exhibits no distension and no mass. There is no tenderness. There is no rebound and no guarding.  Musculoskeletal: Normal range of motion.  Neurological: She is alert and oriented to person, place, and time.  Skin: Skin is warm and dry.  Psychiatric: She has a normal mood and affect.   Results for orders placed or performed during the hospital encounter of 03/02/18 (from the past 24 hour(s))  Urinalysis, Routine w reflex microscopic     Status: Abnormal   Collection Time: 03/02/18  4:45 PM  Result Value Ref Range   Color, Urine YELLOW YELLOW   APPearance HAZY (A) CLEAR   Specific Gravity, Urine 1.014 1.005 - 1.030   pH 6.0 5.0 - 8.0   Glucose, UA NEGATIVE NEGATIVE mg/dL   Hgb urine dipstick NEGATIVE NEGATIVE    Bilirubin Urine NEGATIVE NEGATIVE   Ketones, ur NEGATIVE NEGATIVE mg/dL   Protein, ur NEGATIVE NEGATIVE mg/dL   Nitrite NEGATIVE NEGATIVE   Leukocytes, UA NEGATIVE NEGATIVE  Glucose, capillary     Status: None   Collection Time: 03/02/18  6:59 PM  Result Value Ref Range   Glucose-Capillary 71 65 - 99 mg/dL  CBC     Status: None   Collection Time: 03/02/18  7:31 PM  Result Value Ref Range   WBC 4.7 4.0 - 10.5 K/uL   RBC 4.45 3.87 - 5.11 MIL/uL   Hemoglobin 12.6 12.0 - 15.0 g/dL   HCT 09.8 11.9 - 14.7 %   MCV 83.8 78.0 - 100.0 fL   MCH 28.3 26.0 - 34.0 pg   MCHC 33.8 30.0 - 36.0 g/dL   RDW 82.9 56.2 - 13.0 %   Platelets 222 150 - 400 K/uL  Comprehensive metabolic panel     Status: Abnormal   Collection Time: 03/02/18  7:31 PM  Result Value Ref Range   Sodium 138 135 - 145 mmol/L   Potassium 3.9 3.5 - 5.1 mmol/L   Chloride 106 101 - 111 mmol/L   CO2 25 22 - 32 mmol/L   Glucose, Bld 91 65 - 99 mg/dL   BUN 15 6 - 20 mg/dL   Creatinine, Ser 8.65 0.44 - 1.00 mg/dL   Calcium 9.1 8.9 - 78.4 mg/dL   Total Protein 6.8 6.5 - 8.1 g/dL   Albumin 4.1 3.5 - 5.0 g/dL   AST 17 15 - 41 U/L   ALT 12 (L) 14 - 54 U/L   Alkaline Phosphatase 57 38 - 126 U/L   Total Bilirubin 0.3 0.3 - 1.2 mg/dL   GFR calc non Af Amer >60 >60 mL/min   GFR calc Af Amer >60 >60 mL/min   Anion gap 7 5 - 15   MAU Course  Procedures  MDM Labs and EKG ordered. Labs nml. EKG interpretation by Dr. Ashley Royalty, nothing concerning. Consult with Dr. Vergie Living. Plan for viability [place] and Cardiology referral once OB care established. Instructed pt to proceed to [facility] if another episode occurs. Stable for discharge home.   Assessment and Plan   1. [redacted] weeks gestation of pregnancy   2. Syncope, unspecified syncope type   3. Pregnancy with inconclusive fetal viability, single or unspecified fetus    Discharge home Follow up in OB office as scheduled Follow up for [place] in 3 weeks Return to MAU for OB emergencies  Allergies  as of 03/02/2018   No Known Allergies     Medication List    STOP taking these medications   cyclobenzaprine  5 MG tablet Commonly known as:  FLEXERIL   misoprostol 200 MCG tablet Commonly known as:  CYTOTEC   promethazine 25 MG tablet Commonly known as:  PHENERGAN     TAKE these medications   acetaminophen 325 MG tablet Commonly known as:  TYLENOL Take 650 mg by mouth every 6 (six) hours as needed for mild pain.      Donette Larry, CNM 03/02/2018, 8:17 PM

## 2018-03-02 NOTE — MAU Note (Addendum)
Pt states she passed out around 1500 today & that she vomited while passed out.  States she was unconscious for 15-20 minutes.  States this happened two days ago as well.  Has some body aches from falling.  Denies bleeding.

## 2018-03-02 NOTE — MAU Provider Note (Addendum)
Chief Complaint: Loss of Consciousness and Emesis   First Provider Initiated Contact with Patient 03/02/18 1908      SUBJECTIVE HPI: Angela Hardin is a 22 y.o. G3P1011 at [redacted]w[redacted]d by LMP who presents to maternity admissions reporting loss of consciousness today. Patient states that she was in the shower and became a little dizzy then next thing she remembers is waking up in the shower near vomit. Significant other states that she was unconscious for approximately 10-15 minutes before he found her. She has no recollection of the event. Patient has one similar episode 3-4 days ago. Reports that she had episodes of passing out during her first pregnancy.She also reports that sometimes after/during intercourse she passes out for a few seconds and can be easily aroused by partner. She states she can sometimes predict she is going to lose consciousness because she get a "fuzzy" feeling and becomes dizzy; partner states that sometimes he can hear her gasping for breath prior to losing consciousness. States she has seen a medical provider for this is in past and was told to return if episodes get worse. Denies history of cardiac conditions including hypertension, murmurs, and arrhythmias. Denies family history of relevant cardiac conditions.  She denies vaginal bleeding, vaginal leaking, fevers, contractions, palpitations, and abdominal cramping.   HPI  Past Medical History:  Diagnosis Date  . Pyelonephritis, acute    during 1st pregnancy. tx with abx   Past Surgical History:  Procedure Laterality Date  . FRACTURE SURGERY     Elbow   Social History   Socioeconomic History  . Marital status: Married    Spouse name: Not on file  . Number of children: Not on file  . Years of education: Not on file  . Highest education level: Not on file  Occupational History  . Not on file  Social Needs  . Financial resource strain: Not on file  . Food insecurity:    Worry: Not on file    Inability: Not on file   . Transportation needs:    Medical: Not on file    Non-medical: Not on file  Tobacco Use  . Smoking status: Never Smoker  . Smokeless tobacco: Never Used  Substance and Sexual Activity  . Alcohol use: No    Frequency: Never    Comment: social  . Drug use: No  . Sexual activity: Yes    Birth control/protection: None  Lifestyle  . Physical activity:    Days per week: Not on file    Minutes per session: Not on file  . Stress: Not on file  Relationships  . Social connections:    Talks on phone: Not on file    Gets together: Not on file    Attends religious service: Not on file    Active member of club or organization: Not on file    Attends meetings of clubs or organizations: Not on file    Relationship status: Not on file  . Intimate partner violence:    Fear of current or ex partner: Not on file    Emotionally abused: Not on file    Physically abused: Not on file    Forced sexual activity: Not on file  Other Topics Concern  . Not on file  Social History Narrative  . Not on file   No current facility-administered medications on file prior to encounter.    Current Outpatient Medications on File Prior to Encounter  Medication Sig Dispense Refill  . acetaminophen (TYLENOL) 325 MG  tablet Take 650 mg by mouth every 6 (six) hours as needed for mild pain.    . cyclobenzaprine (FLEXERIL) 5 MG tablet Take 1 tablet (5 mg total) by mouth 3 (three) times daily as needed for muscle spasms. (Patient not taking: Reported on 02/28/2018) 15 tablet 0  . misoprostol (CYTOTEC) 200 MCG tablet Take 4 tablets (800 mcg total) by mouth once for 1 dose. May repeat in 24-48 hours if needed 8 tablet 0  . promethazine (PHENERGAN) 25 MG tablet Take 0.5-1 tablets (12.5-25 mg total) by mouth every 6 (six) hours as needed for nausea or vomiting. (Patient not taking: Reported on 02/28/2018) 30 tablet 0   No Known Allergies  ROS:  Review of Systems  Constitutional: Negative for fever.  Cardiovascular:  Negative for chest pain and palpitations.  Gastrointestinal: Positive for vomiting. Negative for abdominal pain and nausea.  Genitourinary: Negative for vaginal bleeding and vaginal discharge.  Neurological: Negative for dizziness, light-headedness and headaches.   I have reviewed patient's Past Medical Hx, Surgical Hx, Family Hx, Social Hx, medications and allergies.   Physical Exam   Patient Vitals for the past 24 hrs:  BP Temp Temp src Pulse Resp Height Weight  03/02/18 1641 106/64 98.2 F (36.8 C) Oral 99 16 5\' 5"  (1.651 m) 52.2 kg (115 lb)   Constitutional: Well-developed, well-nourished female in no acute distress.  Cardiovascular: normal rate without murmurs, rubs, or gallops Respiratory: normal effort GI: Abd soft, non-tender. Pos BS x 4 Neurologic: Alert and oriented x 4.  PELVIC EXAM: deferred  LAB RESULTS Results for orders placed or performed during the hospital encounter of 03/02/18 (from the past 24 hour(s))  Urinalysis, Routine w reflex microscopic     Status: Abnormal   Collection Time: 03/02/18  4:45 PM  Result Value Ref Range   Color, Urine YELLOW YELLOW   APPearance HAZY (A) CLEAR   Specific Gravity, Urine 1.014 1.005 - 1.030   pH 6.0 5.0 - 8.0   Glucose, UA NEGATIVE NEGATIVE mg/dL   Hgb urine dipstick NEGATIVE NEGATIVE   Bilirubin Urine NEGATIVE NEGATIVE   Ketones, ur NEGATIVE NEGATIVE mg/dL   Protein, ur NEGATIVE NEGATIVE mg/dL   Nitrite NEGATIVE NEGATIVE   Leukocytes, UA NEGATIVE NEGATIVE  Glucose, capillary     Status: None   Collection Time: 03/02/18  6:59 PM  Result Value Ref Range   Glucose-Capillary 71 65 - 99 mg/dL  CBC     Status: None   Collection Time: 03/02/18  7:31 PM  Result Value Ref Range   WBC 4.7 4.0 - 10.5 K/uL   RBC 4.45 3.87 - 5.11 MIL/uL   Hemoglobin 12.6 12.0 - 15.0 g/dL   HCT 96.037.3 45.436.0 - 09.846.0 %   MCV 83.8 78.0 - 100.0 fL   MCH 28.3 26.0 - 34.0 pg   MCHC 33.8 30.0 - 36.0 g/dL   RDW 11.912.1 14.711.5 - 82.915.5 %   Platelets 222 150 -  400 K/uL  Comprehensive metabolic panel     Status: Abnormal   Collection Time: 03/02/18  7:31 PM  Result Value Ref Range   Sodium 138 135 - 145 mmol/L   Potassium 3.9 3.5 - 5.1 mmol/L   Chloride 106 101 - 111 mmol/L   CO2 25 22 - 32 mmol/L   Glucose, Bld 91 65 - 99 mg/dL   BUN 15 6 - 20 mg/dL   Creatinine, Ser 5.620.63 0.44 - 1.00 mg/dL   Calcium 9.1 8.9 - 13.010.3 mg/dL   Total Protein  6.8 6.5 - 8.1 g/dL   Albumin 4.1 3.5 - 5.0 g/dL   AST 17 15 - 41 U/L   ALT 12 (L) 14 - 54 U/L   Alkaline Phosphatase 57 38 - 126 U/L   Total Bilirubin 0.3 0.3 - 1.2 mg/dL   GFR calc non Af Amer >60 >60 mL/min   GFR calc Af Amer >60 >60 mL/min   Anion gap 7 5 - 15    --/--/O POS (01/05 0645)  IMAGING No results found.  MAU Management/MDM: Orders Placed This Encounter  Procedures  . Urinalysis, Routine w reflex microscopic  . Glucose, capillary  . CBC  . Comprehensive metabolic panel  . EKG 12-Lead    No orders of the defined types were placed in this encounter.    ASSESSMENT - [redacted] weeks gestation of pregnancy  - Loss of consciousness; possibly vasovagal   PLAN - Obtain EKG, CBC, CMP, blood glucose, and urinalysis - Referral to cardiology for further workup - Educate on reasons to go to ED - Discharge home     [place], [place] 03/02/2018  7:58 PM

## 2018-03-02 NOTE — Discharge Instructions (Signed)
Syncope Syncope is when you lose temporarily pass out (faint). Signs that you may be about to pass out include:  Feeling dizzy or light-headed.  Feeling sick to your stomach (nauseous).  Seeing all white or all black.  Having cold, clammy skin.  If you passed out, get help right away. Call your local emergency services (911 in the [place] Do not drive yourself to the hospital. Follow these instructions at home: Pay attention to any changes in your symptoms. Take these actions to help with your condition:  Have someone stay with you until you feel stable.  Do not drive, use machinery, or play sports until your doctor says it is okay.  Keep all follow-up visits as told by your doctor. This is important.  If you start to feel like you might pass out, lie down right away and raise (elevate) your feet above the level of your heart. Breathe deeply and steadily. Wait until all of the symptoms are gone.  Drink enough fluid to keep your pee (urine) clear or pale yellow.  If you are taking blood pressure or heart medicine, get up slowly and spend many minutes getting ready to sit and then stand. This can help with dizziness.  Take over-the-counter and prescription medicines only as told by your doctor.  Get help right away if:  You have a very bad headache.  You have unusual pain in your chest, tummy, or back.  You are bleeding from your mouth or rectum.  You have black or tarry poop (stool).  You have a very fast or uneven heartbeat (palpitations).  It hurts to breathe.  You pass out once or more than once.  You have jerky movements that you cannot control (seizure).  You are confused.  You have trouble walking.  You are very weak.  You have vision problems. These symptoms may be an emergency. Do not wait to see if the symptoms will go away. Get medical help right away. Call your local emergency services (911 in the [place] Do not drive yourself to the hospital. This  information is not intended to replace advice given to you by your health care provider. Make sure you discuss any questions you have with your health care provider. Document Released: 04/25/2008 Document Revised: 04/14/2016 Document Reviewed: 07/22/2015 Elsevier Interactive Patient Education  2018 [employer]  

## 2018-03-05 ENCOUNTER — Encounter (HOSPITAL_COMMUNITY): Payer: Self-pay | Admitting: *Deleted

## 2018-03-05 ENCOUNTER — Inpatient Hospital Stay (HOSPITAL_COMMUNITY): Payer: Medicaid Other

## 2018-03-05 ENCOUNTER — Inpatient Hospital Stay (HOSPITAL_COMMUNITY)
Admission: AD | Admit: 2018-03-05 | Discharge: 2018-03-06 | Disposition: A | Discharge status: Home / Self Care | Payer: Medicaid Other | Source: Ambulatory Visit | Attending: Obstetrics & Gynecology | Admitting: Obstetrics & Gynecology

## 2018-03-05 ENCOUNTER — Other Ambulatory Visit: Payer: Self-pay

## 2018-03-05 DIAGNOSIS — O208 Other hemorrhage in early pregnancy: Secondary | ICD-10-CM | POA: Diagnosis not present

## 2018-03-05 DIAGNOSIS — O209 Hemorrhage in early pregnancy, unspecified: Secondary | ICD-10-CM | POA: Insufficient documentation

## 2018-03-05 DIAGNOSIS — Z3A01 Less than 8 weeks gestation of pregnancy: Secondary | ICD-10-CM | POA: Insufficient documentation

## 2018-03-05 DIAGNOSIS — Z3491 Encounter for supervision of normal pregnancy, unspecified, first trimester: Secondary | ICD-10-CM

## 2018-03-05 LAB — CBC
HCT: 35.3 % — ABNORMAL LOW (ref 36.0–46.0)
Hemoglobin: 12 g/dL (ref 12.0–15.0)
MCH: 28.2 pg (ref 26.0–34.0)
MCHC: 34 g/dL (ref 30.0–36.0)
MCV: 83.1 fL (ref 78.0–100.0)
Platelets: 243 10*3/uL (ref 150–400)
RBC: 4.25 MIL/uL (ref 3.87–5.11)
RDW: 12.1 % (ref 11.5–15.5)
WBC: 5.7 10*3/uL (ref 4.0–10.5)

## 2018-03-05 LAB — URINALYSIS, ROUTINE W REFLEX MICROSCOPIC
Bilirubin Urine: NEGATIVE
GLUCOSE, UA: NEGATIVE mg/dL
HGB URINE DIPSTICK: NEGATIVE
KETONES UR: NEGATIVE mg/dL
Leukocytes, UA: NEGATIVE
Nitrite: NEGATIVE
PROTEIN: NEGATIVE mg/dL
Specific Gravity, Urine: 1.015 (ref 1.005–1.030)
pH: 7 (ref 5.0–8.0)

## 2018-03-05 LAB — WET PREP, GENITAL
Clue Cells Wet Prep HPF POC: NONE SEEN
SPERM: NONE SEEN
Trich, Wet Prep: NONE SEEN
YEAST WET PREP: NONE SEEN

## 2018-03-05 LAB — HCG, QUANTITATIVE, PREGNANCY: hCG, Beta Chain, Quant, S: 3495 m[IU]/mL — ABNORMAL HIGH (ref <5)

## 2018-03-05 NOTE — MAU Note (Signed)
Bright red vaginal bleeding that started tonight at work. Hx of miscarriage in January so she wanted to be seen.  Has first appointment on May 20th.

## 2018-03-06 DIAGNOSIS — Z3A01 Less than 8 weeks gestation of pregnancy: Secondary | ICD-10-CM | POA: Diagnosis not present

## 2018-03-06 DIAGNOSIS — O208 Other hemorrhage in early pregnancy: Secondary | ICD-10-CM | POA: Diagnosis not present

## 2018-03-06 NOTE — MAU Provider Note (Signed)
History     CSN: [id number]  Arrival date and time: 03/05/18 2050   First Provider Initiated Contact with Patient 03/06/18 0008     Chief Complaint  Patient presents with  . Vaginal Bleeding   HPI Angela Hardin is a 22 y.o. G3P1011 at [redacted]w[redacted]d who presents with vaginal bleeding. She states the bleeding started while she was at work and it was spotting when she wipes. She denies any pain. She is concerned because she had a recent miscarriage. She reports an LMP of 01/28/18.  OB History    Gravida  3   Para  1   Term  1   Preterm      AB  1   Living  1     SAB  1   TAB      Ectopic      Multiple      Live Births  1           Past Medical History:  Diagnosis Date  . Pyelonephritis, acute    during 1st pregnancy. tx with abx    Past Surgical History:  Procedure Laterality Date  . FRACTURE SURGERY     Elbow    Family History  Problem Relation Age of Onset  . Hyperlipidemia Mother   . Heart disease Maternal Grandfather     Social History   Tobacco Use  . Smoking status: Never Smoker  . Smokeless tobacco: Never Used  Substance Use Topics  . Alcohol use: No    Frequency: Never    Comment: social  . Drug use: No    Allergies: No Known Allergies  Medications Prior to Admission  Medication Sig Dispense Refill Last Dose  . acetaminophen (TYLENOL) 325 MG tablet Take 650 mg by mouth every 6 (six) hours as needed for mild pain.   Past Week at Unknown time    Review of Systems Physical Exam   Blood pressure 118/67, pulse 95, temperature 98.8 F (37.1 C), resp. rate 17, height 5\' 5"  (1.651 m), weight 116 lb 12 oz (53 kg), last menstrual period 01/28/2018, SpO2 99 %, unknown if currently breastfeeding.  Physical Exam  MAU Course  Procedures Results for orders placed or performed during the hospital encounter of 03/05/18 (from the past 24 hour(s))  Urinalysis, Routine w reflex microscopic     Status: None   Collection Time: 03/05/18  9:37 PM   Result Value Ref Range   Color, Urine YELLOW YELLOW   APPearance CLEAR CLEAR   Specific Gravity, Urine 1.015 1.005 - 1.030   pH 7.0 5.0 - 8.0   Glucose, UA NEGATIVE NEGATIVE mg/dL   Hgb urine dipstick NEGATIVE NEGATIVE   Bilirubin Urine NEGATIVE NEGATIVE   Ketones, ur NEGATIVE NEGATIVE mg/dL   Protein, ur NEGATIVE NEGATIVE mg/dL   Nitrite NEGATIVE NEGATIVE   Leukocytes, UA NEGATIVE NEGATIVE  CBC     Status: Abnormal   Collection Time: 03/05/18 10:06 PM  Result Value Ref Range   WBC 5.7 4.0 - 10.5 K/uL   RBC 4.25 3.87 - 5.11 MIL/uL   Hemoglobin 12.0 12.0 - 15.0 g/dL   HCT 40.9 (L) 81.1 - 91.4 %   MCV 83.1 78.0 - 100.0 fL   MCH 28.2 26.0 - 34.0 pg   MCHC 34.0 30.0 - 36.0 g/dL   RDW 78.2 95.6 - 21.3 %   Platelets 243 150 - 400 K/uL  hCG, quantitative, pregnancy     Status: Abnormal   Collection Time: 03/05/18 10:06 PM  Result Value Ref Range   hCG, Beta Chain, Quant, S 3,495 (H) <5 mIU/mL  Wet prep, genital     Status: Abnormal   Collection Time: 03/05/18 11:15 PM  Result Value Ref Range   Yeast Wet Prep HPF POC NONE SEEN NONE SEEN   Trich, Wet Prep NONE SEEN NONE SEEN   Clue Cells Wet Prep HPF POC NONE SEEN NONE SEEN   WBC, Wet Prep HPF POC MODERATE (A) NONE SEEN   Sperm NONE SEEN    [place] Ob Less Than 14 Weeks With Ob Transvaginal  Result Date: 03/05/2018 CLINICAL DATA:  Acute onset of vaginal bleeding. EXAM: OBSTETRIC <14 WK US AND TRANSVAGINAL OB US TECHNIQUE: Both transabdominal and transvaginal ultrasound examinations were performed for complete evaluation of the gestation as well as the maternal uterus, adnexal regions, and pelvic cul-de-sac. Transvaginal technique was performed to assess early pregnancy. COMPARISON:  Pelvic ultrasound performed 12/11/2017 FINDINGS: Intrauterine gestational sac: Single; visualized and normal in shape. Yolk sac:  Yes Embryo:  No MSD: 5 mm   5 w   2  d Subchorionic hemorrhage:  None visualized. Maternal uterus/adnexae: The uterus is  unremarkable in appearance. The ovaries are within normal limits. The right ovary measures 2.8 x 1.7 x 2.0 cm, while the left ovary measures 2.3 x 1.0 x 1.4 cm. There is no evidence for ovarian torsion. No suspicious adnexal masses are seen. No free fluid is seen within the pelvic cul-de-sac. IMPRESSION: A single intrauterine gestational sac noted, with a mean sac diameter of 5 mm. This reflects a gestational age of [redacted] weeks 2 days, which matches the gestational age of [redacted] weeks 1 day by LMP, reflecting an estimated date of delivery of November 04, 2018. A yolk sac is seen. No embryo is yet visualized, within normal limits. Electronically Signed   By: Roanna RaiderJeffery  Chang M.D.   On: 03/05/2018 23:49   MDM UA, UPT CBC, HCG, ABO/Rh Wet prep and gc/chlamydia US OB Transvaginal US OB Comp Less 14 weeks- IUP with gestational sac and yolk sac seen  Assessment and Plan   1. Normal intrauterine pregnancy on prenatal ultrasound in first trimester   2. Vaginal bleeding affecting early pregnancy   3. [redacted] weeks gestation of pregnancy    -Discharge home in stable condition -First trimester precautions discussed -Patient advised to follow-up with OB/GYN of choice to start prenatal care -Patient may return to MAU as needed or if her condition were to change or worsen  Rolm BookbinderCaroline M Kameria Canizares CNM 03/06/2018, 12:08 AM

## 2018-03-06 NOTE — Discharge Instructions (Signed)
[facility]  ° ° °[facility]       Phone: [phone] ° °[facility] Phone: [phone] ° °[facility] [place]  Phone: [phone] ° °[facility] [place]  Phone: [phone] ° °[facility] [place] [place]  Phone: [phone] ° °[facility]       Phone: [phone] ° °[facility]    Phone: [phone]  ° °[facility]    Phone: [phone] ° °[facility]    Phone: [phone] ° °[facility]    Phone: [phone] ° °[facility]    Phone: [phone] ° °[facility]       Phone: [phone]  ° °[facility]  Phone: [phone] ° °[facility]    Phone: [phone] ° °[facility]   Phone: [phone] ° °[facility]      Phone: [phone] ° °[facility]    Phone: [phone] ° °Safe Medications in Pregnancy  ° °Acne: °Benzoyl Peroxide °Salicylic Acid ° °Backache/Headache: °Tylenol: 2 regular strength every 4 hours OR °             2 Extra strength every 6 hours ° °Colds/Coughs/Allergies: °Benadryl (alcohol free) 25 mg every 6 hours as needed °Breath right strips °Claritin °Cepacol throat lozenges °Chloraseptic throat spray °Cold-Eeze- up to three times per day °Cough drops, alcohol free °Flonase (by prescription only) °Guaifenesin °Mucinex °Robitussin DM (plain only, alcohol free) °Saline nasal spray/drops °Sudafed (pseudoephedrine) & Actifed ** use only after [redacted] weeks gestation and if you do not have high blood pressure °Tylenol °Vicks Vaporub °Zinc lozenges °Zyrtec  ° °Constipation: °Colace °Ducolax suppositories °Fleet enema °Glycerin suppositories °Metamucil °Milk of  magnesia °Miralax °Senokot °Smooth move tea ° °Diarrhea: °Kaopectate °Imodium A-D ° °*NO pepto Bismol ° °Hemorrhoids: °Anusol °Anusol HC °Preparation H °Tucks ° °Indigestion: °Tums °Maalox °Mylanta °Zantac  °Pepcid ° °Insomnia: °Benadryl (alcohol free) 25mg every 6 hours as needed °Tylenol PM °Unisom, no Gelcaps ° °Leg Cramps: °Tums °MagGel ° °Nausea/Vomiting:  °Bonine °Dramamine °Emetrol °Ginger extract °Sea bands °Meclizine  °Nausea medication to take during pregnancy:  °Unisom (doxylamine succinate 25 mg tablets) Take one tablet daily at bedtime. If symptoms are not adequately controlled, the dose can be increased to a maximum recommended dose of two tablets daily (1/2 tablet in the morning, 1/2 tablet mid-afternoon and one at bedtime). °Vitamin B6 100mg tablets. Take one tablet twice a day (up to 200 mg per day). ° °Skin Rashes: °Aveeno products °Benadryl cream or 25mg every 6 hours as needed °Calamine Lotion °1% cortisone cream ° °Yeast infection: °Gyne-lotrimin 7 °Monistat 7 ° ° °**If taking multiple medications, please check labels to avoid duplicating the same active ingredients °**take medication as directed on the label °** Do not exceed 4000 mg of tylenol in 24 hours °**Do not take medications that contain aspirin or ibuprofen ° ° ° ° °

## 2018-03-07 LAB — GC/CHLAMYDIA PROBE AMP (~~LOC~~) NOT AT ARMC
Chlamydia: NEGATIVE
NEISSERIA GONORRHEA: NEGATIVE

## 2018-04-09 ENCOUNTER — Encounter: Payer: Medicaid Other | Admitting: Obstetrics & Gynecology

## 2018-04-10 ENCOUNTER — Encounter (HOSPITAL_COMMUNITY): Payer: Self-pay

## 2018-04-10 ENCOUNTER — Inpatient Hospital Stay (HOSPITAL_COMMUNITY)
Admission: AD | Admit: 2018-04-10 | Discharge: 2018-04-10 | Disposition: A | Discharge status: Home / Self Care | Payer: Medicaid Other | Source: Ambulatory Visit | Attending: Obstetrics and Gynecology | Admitting: Obstetrics and Gynecology

## 2018-04-10 DIAGNOSIS — R109 Unspecified abdominal pain: Secondary | ICD-10-CM | POA: Insufficient documentation

## 2018-04-10 DIAGNOSIS — O26891 Other specified pregnancy related conditions, first trimester: Secondary | ICD-10-CM | POA: Diagnosis not present

## 2018-04-10 DIAGNOSIS — Z3A10 10 weeks gestation of pregnancy: Secondary | ICD-10-CM | POA: Insufficient documentation

## 2018-04-10 DIAGNOSIS — O26899 Other specified pregnancy related conditions, unspecified trimester: Secondary | ICD-10-CM

## 2018-04-10 LAB — WET PREP, GENITAL
Clue Cells Wet Prep HPF POC: NONE SEEN
Sperm: NONE SEEN
Trich, Wet Prep: NONE SEEN
YEAST WET PREP: NONE SEEN

## 2018-04-10 LAB — URINALYSIS, ROUTINE W REFLEX MICROSCOPIC
Bilirubin Urine: NEGATIVE
Glucose, UA: NEGATIVE mg/dL
HGB URINE DIPSTICK: NEGATIVE
Ketones, ur: 5 mg/dL — AB
NITRITE: NEGATIVE
PROTEIN: NEGATIVE mg/dL
Specific Gravity, Urine: 1.027 (ref 1.005–1.030)
pH: 5 (ref 5.0–8.0)

## 2018-04-10 NOTE — Discharge Instructions (Signed)
Vaginal Bleeding During Pregnancy, First Trimester °A small amount of bleeding (spotting) from the vagina is common in early pregnancy. Sometimes the bleeding is normal and is not a problem, and sometimes it is a sign of something serious. Be sure to tell your doctor about any bleeding from your vagina right away. °Follow these instructions at home: °· Watch your condition for any changes. °· Follow your doctor's instructions about how active you can be. °· If you are on bed rest: °? You may need to stay in bed and only get up to use the bathroom. °? You may be allowed to do some activities. °? If you need help, make plans for someone to help you. °· Write down: °? The number of pads you use each day. °? How often you change pads. °? How soaked (saturated) your pads are. °· Do not use tampons. °· Do not douche. °· Do not have sex or orgasms until your doctor says it is okay. °· If you pass any tissue from your vagina, save the tissue so you can show it to your doctor. °· Only take medicines as told by your doctor. °· Do not take aspirin because it can make you bleed. °· Keep all follow-up visits as told by your doctor. °Contact a doctor if: °· You bleed from your vagina. °· You have cramps. °· You have labor pains. °· You have a fever that does not go away after you take medicine. °Get help right away if: °· You have very bad cramps in your back or belly (abdomen). °· You pass large clots or tissue from your vagina. °· You bleed more. °· You feel light-headed or weak. °· You pass out (faint). °· You have chills. °· You are leaking fluid or have a gush of fluid from your vagina. °· You pass out while pooping (having a bowel movement). °This information is not intended to replace advice given to you by your health care provider. Make sure you discuss any questions you have with your health care provider. °Document Released: 03/24/2014 Document Revised: 04/14/2016 Document Reviewed: 07/15/2013 °Elsevier Interactive  Patient Education © 2018 [employer] ° °

## 2018-04-10 NOTE — MAU Note (Signed)
N/V yesterday and woke up this AM feeling dizzy. Cramping in abdomen and back since this AM and sharp pain in shoulders, mainly left shoulder. Had some bleeding this AM, did not notice any blood when using bathroom here.

## 2018-04-10 NOTE — MAU Provider Note (Addendum)
History   Patient Aleynah Rocchio is a 22 y.o. G3P1011 At [redacted]w[redacted]d here with complaints of cramping and bleeding that started yesterday. She also felt like her vomiting got much worse yesterday.  She denies abnormal discharge, odor or pain with urination.  CSN: [id number]  Arrival date and time: 04/10/18 1255   None     Chief Complaint  Patient presents with  . Abdominal Pain  . Back Pain  . Vaginal Bleeding  . Dizziness  . Shoulder Pain   Abdominal Pain  This is a new problem. The current episode started yesterday. The onset quality is sudden. The problem has been gradually worsening. The pain is located in the suprapubic region (radiates to her back). The pain is at a severity of 5/10. The quality of the pain is cramping.  Back Pain  Associated symptoms include abdominal pain.  Vaginal Bleeding  The patient's primary symptoms include vaginal bleeding. This is a new problem. The current episode started today. The problem occurs rarely. Associated symptoms include abdominal pain and back pain. The vaginal bleeding is spotting. She has not been passing clots. She has not been passing tissue.  Dizziness  Associated symptoms include abdominal pain.  Shoulder Pain      OB History    Gravida  3   Para  1   Term  1   Preterm      AB  1   Living  1     SAB  1   TAB      Ectopic      Multiple      Live Births  1           Past Medical History:  Diagnosis Date  . Pyelonephritis, acute    during 1st pregnancy. tx with abx    Past Surgical History:  Procedure Laterality Date  . FRACTURE SURGERY     Elbow    Family History  Problem Relation Age of Onset  . Hyperlipidemia Mother   . Heart disease Maternal Grandfather     Social History   Tobacco Use  . Smoking status: Never Smoker  . Smokeless tobacco: Never Used  Substance Use Topics  . Alcohol use: No    Frequency: Never    Comment: social  . Drug use: No    Allergies: No Known  Allergies  Medications Prior to Admission  Medication Sig Dispense Refill Last Dose  . acetaminophen (TYLENOL) 325 MG tablet Take 650 mg by mouth every 6 (six) hours as needed for mild pain.   Past Week at Unknown time    Review of Systems  Respiratory: Negative.   Cardiovascular: Negative.   Gastrointestinal: Positive for abdominal pain.  Genitourinary: Positive for vaginal bleeding.  Musculoskeletal: Positive for back pain.  Neurological: Positive for dizziness.   Physical Exam   Blood pressure (!) 107/59, pulse 97, temperature 98.2 F (36.8 C), temperature source Oral, resp. rate 18, height  (1.651 m), weight 117 lb (53.1 kg), last menstrual period 01/28/2018, unknown if currently breastfeeding.  Physical Exam  Constitutional: She appears well-developed.  HENT:  Head: Normocephalic.  Neck: Normal range of motion.  Respiratory: Effort normal.  GI: Soft.  Genitourinary:  Genitourinary Comments: Normal external female genitalia, vaginal walls pink with no lesions. No discharge, no odor. No CMt, suprapubic or adnexal tenderness. No blood in the vagina.   Musculoskeletal: Normal range of motion.  Neurological: She is alert.  Skin: Skin is warm.    MAU Course  Procedures  MDM -Bedside US shows SIUP with fetal cardiac activity at 150 bpm, confirmed by Doppler.  -wet prep negative -will send Urine for culture; gc chlamydia pending -reviewed with patient that some women have pregnancy-related bleeding and it is completely normal. She should return to the MAU if she has increased bleeding, pain or other ob-gyn complaints.  Assessment and Plan   1. Abdominal pain affecting pregnancy    2. Viewed [place] results with patient at bedside; patient and FOB excited to see FHR on screen.  Patient states that she is relieved to see the baby and that she felt quite nervous beforehand and is now reassured that everything is ok.  3. Patient stable for discharge with return precautions  reviewed.   Charlesetta Garibaldi Kooistra 04/10/2018, 1:46 PM

## 2018-04-11 LAB — CULTURE, OB URINE: CULTURE: NO GROWTH

## 2018-04-11 LAB — GC/CHLAMYDIA PROBE AMP (~~LOC~~) NOT AT ARMC
Chlamydia: NEGATIVE
NEISSERIA GONORRHEA: NEGATIVE

## 2018-04-12 ENCOUNTER — Encounter: Payer: Medicaid Other | Admitting: Certified Nurse Midwife

## 2018-04-19 ENCOUNTER — Ambulatory Visit (INDEPENDENT_AMBULATORY_CARE_PROVIDER_SITE_OTHER): Payer: Medicaid Other | Admitting: Obstetrics and Gynecology

## 2018-04-19 ENCOUNTER — Other Ambulatory Visit: Payer: Self-pay

## 2018-04-19 ENCOUNTER — Other Ambulatory Visit (HOSPITAL_COMMUNITY)
Admission: RE | Admit: 2018-04-19 | Discharge: 2018-04-19 | Disposition: A | Discharge status: Home / Self Care | Payer: Medicaid Other | Source: Ambulatory Visit | Attending: Obstetrics and Gynecology | Admitting: Obstetrics and Gynecology

## 2018-04-19 ENCOUNTER — Encounter: Payer: Self-pay | Admitting: Obstetrics and Gynecology

## 2018-04-19 DIAGNOSIS — Z3480 Encounter for supervision of other normal pregnancy, unspecified trimester: Secondary | ICD-10-CM

## 2018-04-19 DIAGNOSIS — O99341 Other mental disorders complicating pregnancy, first trimester: Secondary | ICD-10-CM

## 2018-04-19 DIAGNOSIS — O99340 Other mental disorders complicating pregnancy, unspecified trimester: Secondary | ICD-10-CM | POA: Insufficient documentation

## 2018-04-19 DIAGNOSIS — F32A Depression, unspecified: Secondary | ICD-10-CM | POA: Insufficient documentation

## 2018-04-19 DIAGNOSIS — F329 Major depressive disorder, single episode, unspecified: Secondary | ICD-10-CM

## 2018-04-19 DIAGNOSIS — Z3481 Encounter for supervision of other normal pregnancy, first trimester: Secondary | ICD-10-CM | POA: Diagnosis not present

## 2018-04-19 NOTE — Patient Instructions (Signed)
First Trimester of Pregnancy The first trimester of pregnancy is from week 1 until the end of week 13 (months 1 through 3). A week after a sperm fertilizes an egg, the egg will implant on the wall of the uterus. This embryo will begin to develop into a baby. Genes from you and your partner will form the baby. The female genes will determine whether the baby will be a boy or a girl. At 6-8 weeks, the eyes and face will be formed, and the heartbeat can be seen on ultrasound. At the end of 12 weeks, all the baby's organs will be formed. Now that you are pregnant, you will want to do everything you can to have a healthy baby. Two of the most important things are to get good prenatal care and to follow your health care provider's instructions. Prenatal care is all the medical care you receive before the baby's birth. This care will help prevent, find, and treat any problems during the pregnancy and childbirth. Body changes during your first trimester Your body goes through many changes during pregnancy. The changes vary from woman to woman.  You may gain or lose a couple of pounds at first.  You may feel sick to your stomach (nauseous) and you may throw up (vomit). If the vomiting is uncontrollable, call your health care provider.  You may tire easily.  You may develop headaches that can be relieved by medicines. All medicines should be approved by your health care provider.  You may urinate more often. Painful urination may mean you have a bladder infection.  You may develop heartburn as a result of your pregnancy.  You may develop constipation because certain hormones are causing the muscles that push stool through your intestines to slow down.  You may develop hemorrhoids or swollen veins (varicose veins).  Your breasts may begin to grow larger and become tender. Your nipples may stick out more, and the tissue that surrounds them (areola) may become darker.  Your gums may bleed and may be  sensitive to brushing and flossing.  Dark spots or blotches (chloasma, mask of pregnancy) may develop on your face. This will likely fade after the baby is born.  Your menstrual periods will stop.  You may have a loss of appetite.  You may develop cravings for certain kinds of food.  You may have changes in your emotions from day to day, such as being excited to be pregnant or being concerned that something may go wrong with the pregnancy and baby.  You may have more vivid and strange dreams.  You may have changes in your hair. These can include thickening of your hair, rapid growth, and changes in texture. Some women also have hair loss during or after pregnancy, or hair that feels dry or thin. Your hair will most likely return to normal after your baby is born.  What to expect at prenatal visits During a routine prenatal visit:  You will be weighed to make sure you and the baby are growing normally.  Your blood pressure will be taken.  Your abdomen will be measured to track your baby's growth.  The fetal heartbeat will be listened to between weeks 10 and 14 of your pregnancy.  Test results from any previous visits will be discussed.  Your health care provider may ask you:  How you are feeling.  If you are feeling the baby move.  If you have had any abnormal symptoms, such as leaking fluid, bleeding, severe headaches,   or abdominal cramping.  If you are using any tobacco products, including cigarettes, chewing tobacco, and electronic cigarettes.  If you have any questions.  Other tests that may be performed during your first trimester include:  Blood tests to find your blood type and to check for the presence of any previous infections. The tests will also be used to check for low iron levels (anemia) and protein on red blood cells (Rh antibodies). Depending on your risk factors, or if you previously had diabetes during pregnancy, you may have tests to check for high blood  sugar that affects pregnant women (gestational diabetes).  Urine tests to check for infections, diabetes, or protein in the urine.  An ultrasound to confirm the proper growth and development of the baby.  Fetal screens for spinal cord problems (spina bifida) and Down syndrome.  HIV (human immunodeficiency virus) testing. Routine prenatal testing includes screening for HIV, unless you choose not to have this test.  You may need other tests to make sure you and the baby are doing well.  Follow these instructions at home: Medicines  Follow your health care provider's instructions regarding medicine use. Specific medicines may be either safe or unsafe to take during pregnancy.  Take a prenatal vitamin that contains at least 600 micrograms (mcg) of folic acid.  If you develop constipation, try taking a stool softener if your health care provider approves. Eating and drinking  Eat a balanced diet that includes fresh fruits and vegetables, whole grains, good sources of protein such as meat, eggs, or tofu, and low-fat dairy. Your health care provider will help you determine the amount of weight gain that is right for you.  Avoid raw meat and uncooked cheese. These carry germs that can cause birth defects in the baby.  Eating four or five small meals rather than three large meals a day may help relieve nausea and vomiting. If you start to feel nauseous, eating a few soda crackers can be helpful. Drinking liquids between meals, instead of during meals, also seems to help ease nausea and vomiting.  Limit foods that are high in fat and processed sugars, such as fried and sweet foods.  To prevent constipation: ? Eat foods that are high in fiber, such as fresh fruits and vegetables, whole grains, and beans. ? Drink enough fluid to keep your urine clear or pale yellow. Activity  Exercise only as directed by your health care provider. Most women can continue their usual exercise routine during  pregnancy. Try to exercise for 30 minutes at least 5 days a week. Exercising will help you: ? Control your weight. ? Stay in shape. ? Be prepared for labor and delivery.  Experiencing pain or cramping in the lower abdomen or lower back is a good sign that you should stop exercising. Check with your health care provider before continuing with normal exercises.  Try to avoid standing for long periods of time. Move your legs often if you must stand in one place for a long time.  Avoid heavy lifting.  Wear low-heeled shoes and practice good posture.  You may continue to have sex unless your health care provider tells you not to. Relieving pain and discomfort  Wear a good support bra to relieve breast tenderness.  Take warm sitz baths to soothe any pain or discomfort caused by hemorrhoids. Use hemorrhoid cream if your health care provider approves.  Rest with your legs elevated if you have leg cramps or low back pain.  If you develop   varicose veins in your legs, wear support hose. Elevate your feet for 15 minutes, 3-4 times a day. Limit salt in your diet. Prenatal care  Schedule your prenatal visits by the twelfth week of pregnancy. They are usually scheduled monthly at first, then more often in the last 2 months before delivery.  Write down your questions. Take them to your prenatal visits.  Keep all your prenatal visits as told by your health care provider. This is important. Safety  Wear your seat belt at all times when driving.  Make a list of emergency phone numbers, including numbers for family, friends, the hospital, and police and fire departments. General instructions  Ask your health care provider for a referral to a local prenatal education class. Begin classes no later than the beginning of month 6 of your pregnancy.  Ask for help if you have counseling or nutritional needs during pregnancy. Your health care provider can offer advice or refer you to specialists for help  with various needs.  Do not use hot tubs, steam rooms, or saunas.  Do not douche or use tampons or scented sanitary pads.  Do not cross your legs for long periods of time.  Avoid cat litter boxes and soil used by cats. These carry germs that can cause birth defects in the baby and possibly loss of the fetus by miscarriage or stillbirth.  Avoid all smoking, herbs, alcohol, and medicines not prescribed by your health care provider. Chemicals in these products affect the formation and growth of the baby.  Do not use any products that contain nicotine or tobacco, such as cigarettes and e-cigarettes. If you need help quitting, ask your health care provider. You may receive counseling support and other resources to help you quit.  Schedule a dentist appointment. At home, brush your teeth with a soft toothbrush and be gentle when you floss. Contact a health care provider if:  You have dizziness.  You have mild pelvic cramps, pelvic pressure, or nagging pain in the abdominal area.  You have persistent nausea, vomiting, or diarrhea.  You have a bad smelling vaginal discharge.  You have pain when you urinate.  You notice increased swelling in your face, hands, legs, or ankles.  You are exposed to fifth disease or chickenpox.  You are exposed to German measles (rubella) and have never had it. Get help right away if:  You have a fever.  You are leaking fluid from your vagina.  You have spotting or bleeding from your vagina.  You have severe abdominal cramping or pain.  You have rapid weight gain or loss.  You vomit blood or material that looks like coffee grounds.  You develop a severe headache.  You have shortness of breath.  You have any kind of trauma, such as from a fall or a car accident. Summary  The first trimester of pregnancy is from week 1 until the end of week 13 (months 1 through 3).  Your body goes through many changes during pregnancy. The changes vary from  woman to woman.  You will have routine prenatal visits. During those visits, your health care provider will examine you, discuss any test results you may have, and talk with you about how you are feeling. This information is not intended to replace advice given to you by your health care provider. Make sure you discuss any questions you have with your health care provider. Document Released: 11/01/2001 Document Revised: 10/19/2016 Document Reviewed: 10/19/2016 Elsevier Interactive Patient Education  2018 [employer]   Inc[employer]  

## 2018-04-19 NOTE — Progress Notes (Signed)
Presents for NOB visit. 

## 2018-04-19 NOTE — Progress Notes (Signed)
Subjective:  Angela Hardin is a 22 y.o. G3P1011 at [redacted]w[redacted]d being seen today for her first OB visit. EDD by LMP, confirmed by first trimester U/S. H/O depression previously took Zoloft. O/W no chronic medical problems or medications. H/O TSVD 2016 without problems. H/O first trimester SAB. She is currently monitored for the following issues for this low-risk pregnancy and has Supervision of other normal pregnancy, antepartum and Depression affecting pregnancy on their problem list.  Patient reports no complaints.  Contractions: Not present. Vag. Bleeding: None.   . Denies leaking of fluid.   The following portions of the patient's history were reviewed and updated as appropriate: allergies, current medications, past family history, past medical history, past social history, past surgical history and problem list. Problem list updated.  Objective:   Vitals:   04/19/18 1345  BP: 99/64  Pulse: 79  Temp: (!) 97.1 F (36.2 C)  Weight: 121 lb 12.8 oz (55.2 kg)    Fetal Status:           General:  Alert, oriented and cooperative. Patient is in no acute distress.  Skin: Skin is warm and dry. No rash noted.   Cardiovascular: Normal heart rate noted  Respiratory: Normal respiratory effort, no problems with respiration noted  Abdomen: Soft, gravid, appropriate for gestational age. Pain/Pressure: Absent     Pelvic:  Cervical exam performed        Extremities: Normal range of motion.  Edema: None  Mental Status: Normal mood and affect. Normal behavior. Normal judgment and thought content.  Breast sym supple no masses or adenopathy  Urinalysis:      Assessment and Plan:  Pregnancy: G3P1011 at [redacted]w[redacted]d  1. Supervision of other normal pregnancy, antepartum Prenatal care and labs reviewed with pt  2. Depression affecting pregnancy Stable presently. Offered counseling with Asher Muir and or restarting Zoloft. Zoloft in pregnancy reviewed Undecided at present  Preterm labor symptoms and general  obstetric precautions including but not limited to vaginal bleeding, contractions, leaking of fluid and fetal movement were reviewed in detail with the patient. Please refer to After Visit Summary for other counseling recommendations.  Return in about 1 month (around 05/17/2018) for OB visit.   Hermina Staggers, MD

## 2018-04-23 LAB — OBSTETRIC PANEL, INCLUDING HIV
Antibody Screen: NEGATIVE
BASOS ABS: 0 10*3/uL (ref 0.0–0.2)
Basos: 1 %
EOS (ABSOLUTE): 0 10*3/uL (ref 0.0–0.4)
Eos: 0 %
HEP B S AG: NEGATIVE
HIV SCREEN 4TH GENERATION: NONREACTIVE
Hematocrit: 38.1 % (ref 34.0–46.6)
Hemoglobin: 12.4 g/dL (ref 11.1–15.9)
Immature Grans (Abs): 0 10*3/uL (ref 0.0–0.1)
Immature Granulocytes: 0 %
LYMPHS ABS: 1.6 10*3/uL (ref 0.7–3.1)
Lymphs: 20 %
MCH: 27.8 pg (ref 26.6–33.0)
MCHC: 32.5 g/dL (ref 31.5–35.7)
MCV: 85 fL (ref 79–97)
Monocytes Absolute: 0.4 10*3/uL (ref 0.1–0.9)
Monocytes: 5 %
NEUTROS ABS: 6.1 10*3/uL (ref 1.4–7.0)
Neutrophils: 74 %
PLATELETS: 237 10*3/uL (ref 150–450)
RBC: 4.46 x10E6/uL (ref 3.77–5.28)
RDW: 14.2 % (ref 12.3–15.4)
RH TYPE: POSITIVE
RPR Ser Ql: NONREACTIVE
Rubella Antibodies, IGG: 1.12 index (ref >0.99)
WBC: 8.2 10*3/uL (ref 3.4–10.8)

## 2018-04-23 LAB — HEMOGLOBINOPATHY EVALUATION
HGB A: 97.3 % (ref 96.4–98.8)
HGB C: 0 %
HGB S: 0 %
HGB VARIANT: 0 %
Hemoglobin A2 Quantitation: 2.7 % (ref 1.8–3.2)
Hemoglobin F Quantitation: 0 % (ref 0.0–2.0)

## 2018-04-24 LAB — CYTOLOGY - PAP
DIAGNOSIS: NEGATIVE
HPV (WINDOPATH): NOT DETECTED

## 2018-04-26 ENCOUNTER — Encounter: Payer: Self-pay | Admitting: *Deleted

## 2018-04-26 LAB — CYSTIC FIBROSIS MUTATION 97: GENE DIS ANAL CARRIER INTERP BLD/T-IMP: NOT DETECTED

## 2018-05-12 ENCOUNTER — Inpatient Hospital Stay (HOSPITAL_COMMUNITY)
Admission: AD | Admit: 2018-05-12 | Discharge: 2018-05-12 | Disposition: A | Discharge status: Home / Self Care | Payer: Medicaid Other | Source: Ambulatory Visit | Attending: Obstetrics & Gynecology | Admitting: Obstetrics & Gynecology

## 2018-05-12 ENCOUNTER — Encounter (HOSPITAL_COMMUNITY): Payer: Self-pay

## 2018-05-12 DIAGNOSIS — O23592 Infection of other part of genital tract in pregnancy, second trimester: Secondary | ICD-10-CM

## 2018-05-12 DIAGNOSIS — R109 Unspecified abdominal pain: Secondary | ICD-10-CM | POA: Diagnosis present

## 2018-05-12 DIAGNOSIS — N760 Acute vaginitis: Secondary | ICD-10-CM

## 2018-05-12 DIAGNOSIS — B3731 Acute candidiasis of vulva and vagina: Secondary | ICD-10-CM

## 2018-05-12 DIAGNOSIS — B9689 Other specified bacterial agents as the cause of diseases classified elsewhere: Secondary | ICD-10-CM | POA: Diagnosis not present

## 2018-05-12 DIAGNOSIS — O26892 Other specified pregnancy related conditions, second trimester: Secondary | ICD-10-CM | POA: Diagnosis present

## 2018-05-12 DIAGNOSIS — B373 Candidiasis of vulva and vagina: Secondary | ICD-10-CM

## 2018-05-12 DIAGNOSIS — Z3A14 14 weeks gestation of pregnancy: Secondary | ICD-10-CM | POA: Insufficient documentation

## 2018-05-12 HISTORY — DX: Syncope and collapse: R55

## 2018-05-12 LAB — URINALYSIS, ROUTINE W REFLEX MICROSCOPIC
Bilirubin Urine: NEGATIVE
GLUCOSE, UA: NEGATIVE mg/dL
HGB URINE DIPSTICK: NEGATIVE
KETONES UR: NEGATIVE mg/dL
NITRITE: NEGATIVE
Protein, ur: NEGATIVE mg/dL
Specific Gravity, Urine: 1.021 (ref 1.005–1.030)
pH: 5 (ref 5.0–8.0)

## 2018-05-12 LAB — WET PREP, GENITAL
Clue Cells Wet Prep HPF POC: NONE SEEN
Sperm: NONE SEEN
Trich, Wet Prep: NONE SEEN
Yeast Wet Prep HPF POC: NONE SEEN

## 2018-05-12 MED ORDER — METRONIDAZOLE 500 MG PO TABS: 500.0000 mg | ORAL_TABLET | Freq: Two times a day (BID) | ORAL | 0 refills | Status: DC

## 2018-05-12 MED ORDER — FLUCONAZOLE 200 MG PO TABS: 200.0000 mg | ORAL_TABLET | Freq: Every day | ORAL | 0 refills | Status: AC

## 2018-05-12 NOTE — MAU Note (Signed)
Angela PostKailee Pavich is a 22 y.o. at 3876w6d here in [facility]U reporting:  +vaginal discharge Thick ,white, foul odor +lower abdominal cramping; intermittent Pain score: 4/10 Vitals:   05/12/18 1213  BP: 92/60  Pulse: 85  Resp: 16  Temp: 97.7 F (36.5 C)  SpO2: 100%     FHT: 147 Lab orders placed from triage: ua, wet prep, and gc verbal ok for patient to self swab and return to lobby per LemoyneKooistra CNM

## 2018-05-12 NOTE — MAU Provider Note (Signed)
History   22 year old G3 P1-0-1-1 at 14 weeks and 6 days and with vaginal odor and itching for 2 to 3 days now.  She denies any other complaints  CSN: [id number]  Arrival date & time 05/12/18  1148   None     Chief Complaint  Patient presents with  . Vaginal Discharge  . Abdominal Pain    HPI  Past Medical History:  Diagnosis Date  . Anxiety   . Depression   . Pyelonephritis, acute    during 1st pregnancy. tx with abx  . Vaso vagal episode     Past Surgical History:  Procedure Laterality Date  . FRACTURE SURGERY     Elbow    Family History  Problem Relation Age of Onset  . Hyperlipidemia Mother   . Miscarriages / [place] Mother   . Heart disease Maternal Grandfather   . Hypertension Maternal Grandfather   . Anxiety disorder Father   . Depression Father     Social History   Tobacco Use  . Smoking status: Never Smoker  . Smokeless tobacco: Never Used  Substance Use Topics  . Alcohol use: No    Frequency: Never    Comment: social  . Drug use: No    OB History    Gravida  3   Para  1   Term  1   Preterm      AB  1   Living  1     SAB  1   TAB      Ectopic      Multiple      Live Births  1           Review of Systems  Constitutional: Negative.   HENT: Negative.   Eyes: Negative.   Respiratory: Negative.   Cardiovascular: Negative.   Gastrointestinal: Negative.   Endocrine: Negative.   Genitourinary: Positive for vaginal discharge.       Vaginal itching  Musculoskeletal: Negative.   Skin: Negative.   Allergic/Immunologic: Negative.   Neurological: Negative.   Hematological: Negative.   Psychiatric/Behavioral: Negative.     Allergies  Patient has no known allergies.  Home Medications    BP 92/60 (BP Location: Right Arm)   Pulse 85   Temp 97.7 F (36.5 C) (Oral)   Resp 16   Wt 116 lb 1.9 oz (52.7 kg)   LMP 01/28/2018   SpO2 100% Comment: ra  BMI 19.32 kg/m   Physical Exam  Constitutional: She is oriented  to person, place, and time. She appears well-developed and well-nourished.  HENT:  Head: Normocephalic.  Cardiovascular: Normal rate, regular rhythm, normal heart sounds and intact distal pulses.  Pulmonary/Chest: Effort normal and breath sounds normal.  Abdominal: Normal appearance.  Genitourinary: Uterus normal. Vaginal discharge found.  Neurological: She is alert and oriented to person, place, and time.  Skin: Skin is warm and dry.  Psychiatric: She has a normal mood and affect. Her behavior is normal.    MAU Course  Procedures (including critical care time)  Labs Reviewed  WET PREP, GENITAL - Abnormal; Notable for the following components:      Result Value   WBC, Wet Prep HPF POC FEW (*)    All other components within normal limits  URINALYSIS, ROUTINE W REFLEX MICROSCOPIC - Abnormal; Notable for the following components:   Color, Urine AMBER (*)    APPearance CLOUDY (*)    Leukocytes, UA SMALL (*)    Bacteria, UA MANY (*)  All other components within normal limits  GC/CHLAMYDIA PROBE AMP ([place]) NOT AT [facility]   No results found.   No diagnosis found.    MDM  Wet prep was be recollected and read by myself there is too numerous to count clue and yeast present.  Fetal heart rate is strong and regular per Doppler.  I will treat the patient for BV and yeast and discharge her home

## 2018-05-14 LAB — GC/CHLAMYDIA PROBE AMP (~~LOC~~) NOT AT ARMC
CHLAMYDIA, DNA PROBE: NEGATIVE
Neisseria Gonorrhea: NEGATIVE

## 2018-05-17 ENCOUNTER — Ambulatory Visit (INDEPENDENT_AMBULATORY_CARE_PROVIDER_SITE_OTHER): Payer: Medicaid Other | Admitting: Obstetrics & Gynecology

## 2018-05-17 ENCOUNTER — Encounter: Payer: Self-pay | Admitting: Obstetrics & Gynecology

## 2018-05-17 VITALS — BP 109/75 | HR 96 | Wt 115.9 lb

## 2018-05-17 DIAGNOSIS — O219 Vomiting of pregnancy, unspecified: Secondary | ICD-10-CM | POA: Diagnosis not present

## 2018-05-17 DIAGNOSIS — F32A Depression, unspecified: Secondary | ICD-10-CM

## 2018-05-17 DIAGNOSIS — O99340 Other mental disorders complicating pregnancy, unspecified trimester: Secondary | ICD-10-CM

## 2018-05-17 DIAGNOSIS — Z3482 Encounter for supervision of other normal pregnancy, second trimester: Secondary | ICD-10-CM | POA: Diagnosis not present

## 2018-05-17 DIAGNOSIS — Z3689 Encounter for other specified antenatal screening: Secondary | ICD-10-CM | POA: Diagnosis not present

## 2018-05-17 DIAGNOSIS — Z3480 Encounter for supervision of other normal pregnancy, unspecified trimester: Secondary | ICD-10-CM

## 2018-05-17 DIAGNOSIS — F329 Major depressive disorder, single episode, unspecified: Secondary | ICD-10-CM

## 2018-05-17 MED ORDER — SERTRALINE HCL 50 MG PO TABS: 50.0000 mg | ORAL_TABLET | Freq: Every day | ORAL | 4 refills | Status: AC

## 2018-05-17 MED ORDER — ONDANSETRON 4 MG PO TBDP: 4.0000 mg | ORAL_TABLET | Freq: Four times a day (QID) | ORAL | 2 refills | Status: AC | PRN

## 2018-05-17 MED ORDER — PROMETHAZINE HCL 25 MG PO TABS: 25.0000 mg | ORAL_TABLET | Freq: Four times a day (QID) | ORAL | 2 refills | Status: AC | PRN

## 2018-05-17 NOTE — Progress Notes (Signed)
   PRENATAL VISIT NOTE  Subjective:  Angela Hardin is a 22 y.o. G3P1011 at 5234w4d being seen today for ongoing prenatal care.  She is currently monitored for the following issues for this low-risk pregnancy and has Supervision of other normal pregnancy, antepartum and Depression affecting pregnancy on their problem list.  Patient reports nausea and vomiting. Also reports depressed mood and insomnia, wants to restart pre-pregnancy Zoloft.  Contractions: Not present. Vag. Bleeding: None.  Movement: Present. Denies leaking of fluid.   The following portions of the patient's history were reviewed and updated as appropriate: allergies, current medications, past family history, past medical history, past social history, past surgical history and problem list. Problem list updated.  Objective:   Vitals:   05/17/18 0841  BP: 109/75  Pulse: 96  Weight: 115 lb 14.4 oz (52.6 kg)    Fetal Status: Fetal Heart Rate (bpm): 133   Movement: Present     General:  Alert, oriented and cooperative. Patient is in no acute distress.  Skin: Skin is warm and dry. No rash noted.   Cardiovascular: Normal heart rate noted  Respiratory: Normal respiratory effort, no problems with respiration noted  Abdomen: Soft, gravid, appropriate for gestational age.  Pain/Pressure: Present     Pelvic: Cervical exam deferred        Extremities: Normal range of motion.  Edema: None  Mental Status: Normal mood and affect. Normal behavior. Normal judgment and thought content.   Assessment and Plan:  Pregnancy: G3P1011 at 2834w4d  1. Depression affecting pregnancy Patient desires Zoloft; has been on this in the past. Counseled about rare risk of PPHN and risk of PPH after delivery; risks outweighed by benefits of this medication. Will monitor response. - sertraline (ZOLOFT) 50 MG tablet; Take 1 tablet (50 mg total) by mouth daily.  Dispense: 30 tablet; Refill: 4  2. Nausea and vomiting during pregnancy Antiemetics prescribed -  ondansetron (ZOFRAN ODT) 4 MG disintegrating tablet; Take 1 tablet (4 mg total) by mouth every 6 (six) hours as needed for nausea.  Dispense: 20 tablet; Refill: 2 - promethazine (PHENERGAN) 25 MG tablet; Take 1 tablet (25 mg total) by mouth every 6 (six) hours as needed for nausea or vomiting.  Dispense: 30 tablet; Refill: 2  3. Encounter for fetal anatomic survey Anatomy scan ordered - US MFM OB COMP + 14 WK; Future  4. Supervision of other normal pregnancy, antepartum Had normal NIPS, desires AFP for OSB screening - AFP, Serum, Open Spina Bifida No other complaints or concerns.  Routine obstetric precautions reviewed. Please refer to After Visit Summary for other counseling recommendations.  Return in about 1 month (around 06/14/2018) for OB Visit.  Jaynie CollinsUgonna Jahaziel Francois, MD

## 2018-05-17 NOTE — Patient Instructions (Signed)
Return to clinic for any scheduled appointments or obstetric concerns, or go to MAU for evaluation  

## 2018-05-19 LAB — AFP, SERUM, OPEN SPINA BIFIDA
AFP MoM: 0.98
AFP Value: 35.9 ng/mL
GEST. AGE ON COLLECTION DATE: 15.6 wk
Maternal Age At EDD: 23.3 yr
OSBR Risk 1 IN: 10000
TEST RESULTS AFP: NEGATIVE
Weight: 115 [lb_av]

## 2018-06-11 ENCOUNTER — Encounter (HOSPITAL_COMMUNITY): Payer: Self-pay

## 2018-06-14 ENCOUNTER — Encounter: Payer: Self-pay | Admitting: Obstetrics and Gynecology

## 2018-06-14 ENCOUNTER — Ambulatory Visit (INDEPENDENT_AMBULATORY_CARE_PROVIDER_SITE_OTHER): Payer: Medicaid Other | Admitting: Obstetrics and Gynecology

## 2018-06-14 VITALS — BP 115/80 | HR 80 | Wt 121.8 lb

## 2018-06-14 DIAGNOSIS — O99340 Other mental disorders complicating pregnancy, unspecified trimester: Secondary | ICD-10-CM | POA: Diagnosis not present

## 2018-06-14 DIAGNOSIS — F32A Depression, unspecified: Secondary | ICD-10-CM

## 2018-06-14 DIAGNOSIS — Z3480 Encounter for supervision of other normal pregnancy, unspecified trimester: Secondary | ICD-10-CM

## 2018-06-14 DIAGNOSIS — F329 Major depressive disorder, single episode, unspecified: Secondary | ICD-10-CM

## 2018-06-14 DIAGNOSIS — Z3482 Encounter for supervision of other normal pregnancy, second trimester: Secondary | ICD-10-CM | POA: Diagnosis not present

## 2018-06-14 NOTE — Progress Notes (Signed)
   PRENATAL VISIT NOTE  Subjective:  Angela PostKailee Hardin is a 22 y.o. G3P1011 at 3621w4d being seen today for ongoing prenatal care.  She is currently monitored for the following issues for this low-risk pregnancy and has Supervision of other normal pregnancy, antepartum and Depression affecting pregnancy on their problem list.  Patient reports occasional mild cramping.  Contractions: Irritability. Vag. Bleeding: None.  Movement: Present. Denies leaking of fluid.   The following portions of the patient's history were reviewed and updated as appropriate: allergies, current medications, past family history, past medical history, past social history, past surgical history and problem list. Problem list updated.  Objective:   Vitals:   06/14/18 0839  BP: 115/80  Pulse: 80  Weight: 121 lb 12.8 oz (55.2 kg)    Fetal Status:     Movement: Present     General:  Alert, oriented and cooperative. Patient is in no acute distress.  Skin: Skin is warm and dry. No rash noted.   Cardiovascular: Normal heart rate noted  Respiratory: Normal respiratory effort, no problems with respiration noted  Abdomen: Soft, gravid, appropriate for gestational age.  Pain/Pressure: Absent     Pelvic: Cervical exam deferred        Extremities: Normal range of motion.  Edema: Trace  Mental Status: Normal mood and affect. Normal behavior. Normal judgment and thought content.   Assessment and Plan:  Pregnancy: G3P1011 at 2021w4d  1. Supervision of other normal pregnancy, antepartum Has anatomy scheduled for 7/29  2. Depression affecting pregnancy Feels mood is much better on zoloft  [place] and will be [place] care there. Encouraged her to establish care and obtain records as needed.   Preterm labor symptoms and general obstetric precautions including but not limited to vaginal bleeding, contractions, leaking of fluid and fetal movement were reviewed in detail with the  patient. Please refer to After Visit Summary for other counseling recommendations.  Return for OB visit.  Future Appointments  Date Time Provider Department Center  06/18/2018  8:30 AM [facility] US 1 [facility]    Conan BowensKelly M Davis, MD

## 2018-06-18 ENCOUNTER — Other Ambulatory Visit: Payer: Self-pay | Admitting: Obstetrics & Gynecology

## 2018-06-18 ENCOUNTER — Ambulatory Visit (HOSPITAL_COMMUNITY)
Admission: RE | Admit: 2018-06-18 | Discharge: 2018-06-18 | Disposition: A | Discharge status: Home / Self Care | Payer: Medicaid Other | Source: Ambulatory Visit | Attending: Obstetrics & Gynecology | Admitting: Obstetrics & Gynecology

## 2018-06-18 ENCOUNTER — Encounter (HOSPITAL_COMMUNITY): Payer: Self-pay

## 2018-06-18 DIAGNOSIS — Z3686 Encounter for antenatal screening for cervical length: Secondary | ICD-10-CM

## 2018-06-18 DIAGNOSIS — Z363 Encounter for antenatal screening for malformations: Secondary | ICD-10-CM

## 2018-06-18 DIAGNOSIS — Z3A20 20 weeks gestation of pregnancy: Secondary | ICD-10-CM

## 2018-06-18 DIAGNOSIS — Z3689 Encounter for other specified antenatal screening: Secondary | ICD-10-CM

## 2019-01-14 IMAGING — US US OB COMP LESS 14 WK
1 series · 14 of 28 positions shown · non-contrast
Comparison: None.

CLINICAL DATA: Pregnant patient with abdominal cramping.

EXAM:
OBSTETRIC <14 WK US AND TRANSVAGINAL OB US
TECHNIQUE: Both transabdominal and transvaginal ultrasound examinations were
performed for complete evaluation of the gestation as well as the
maternal uterus, adnexal regions, and pelvic cul-de-sac.
Transvaginal technique was performed to assess early pregnancy.

[Series 1: us ob comp less 14 wk · 0.11mm/px · 14 of 46 slices shown]
[im 2/46]
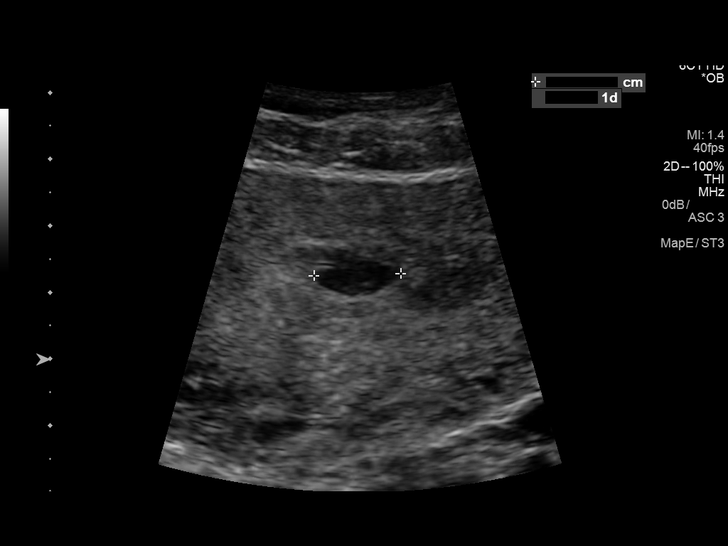
[im 6/46]
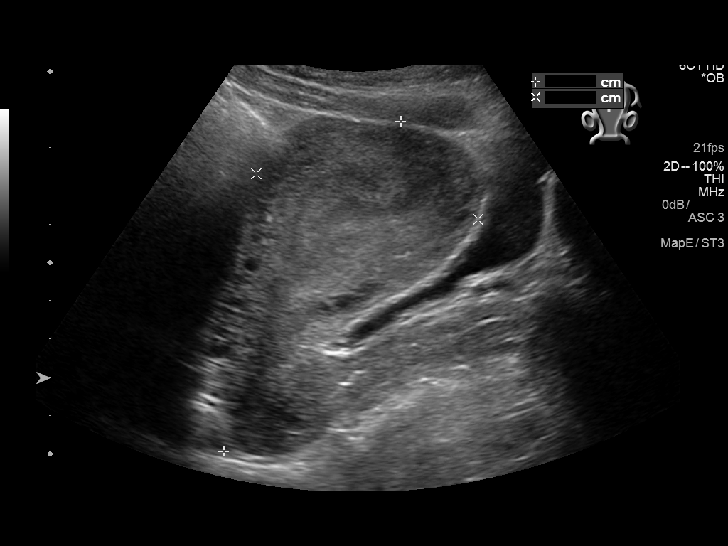
[im 9/46]
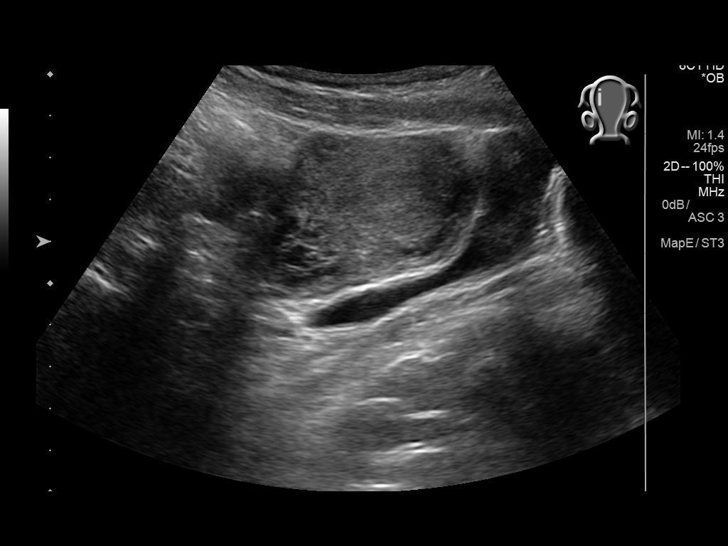
[im 12/46]
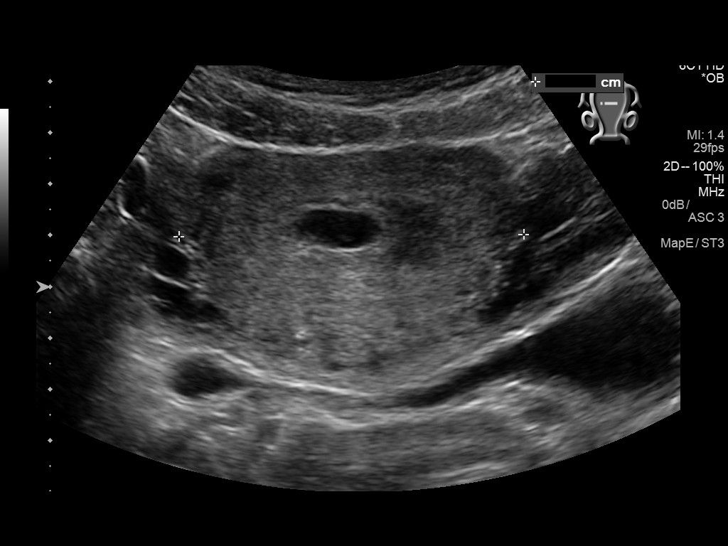
[im 16/46]
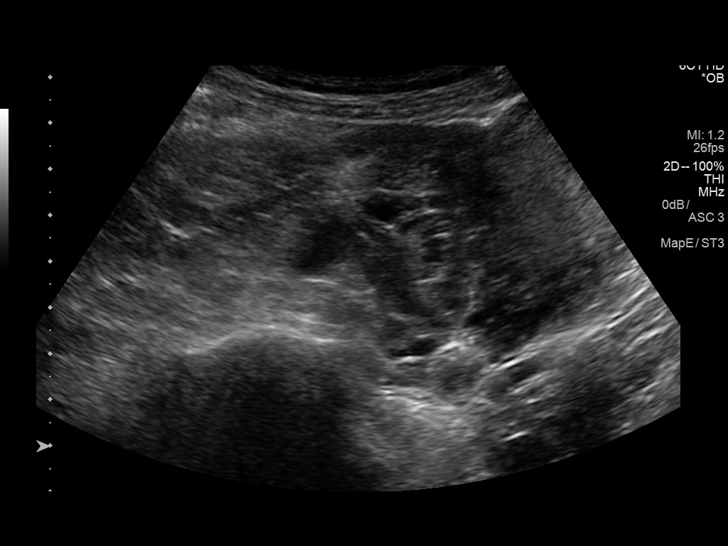
[im 19/46]
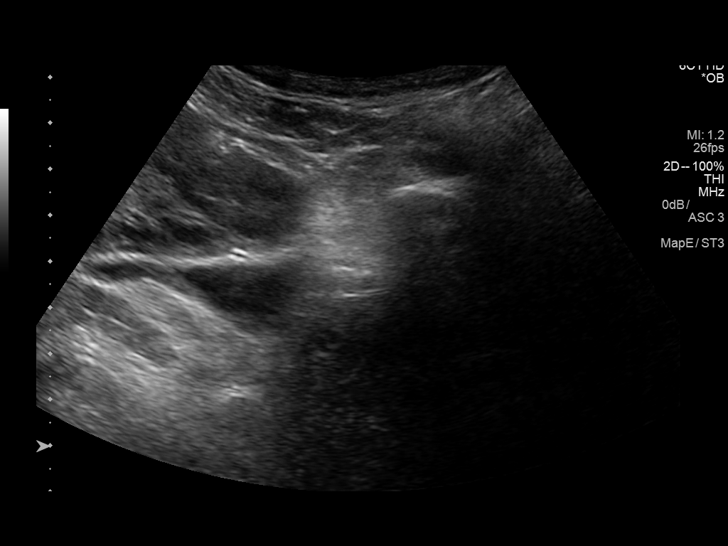
[im 22/46]
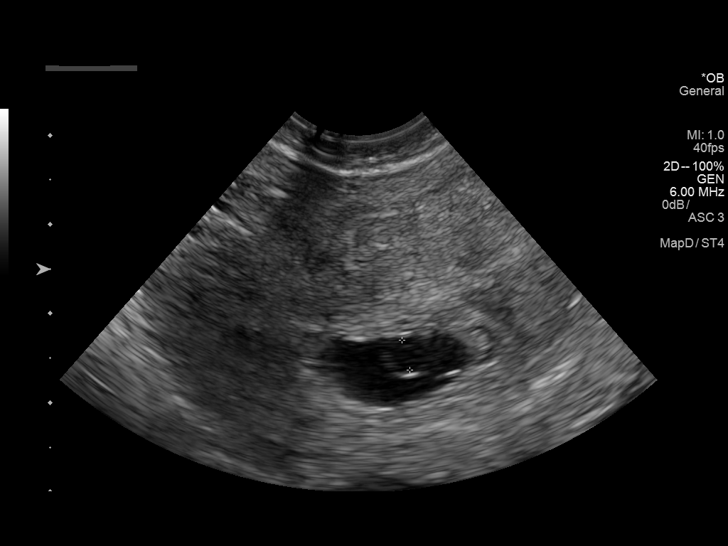
[im 26/46]
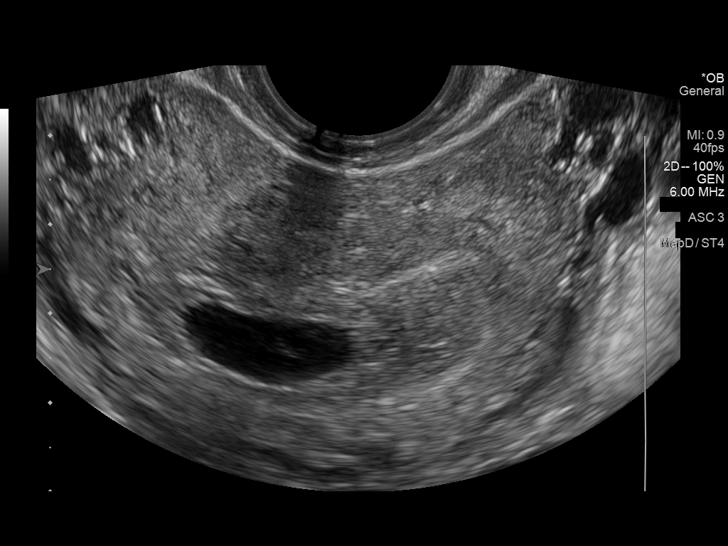
[im 29/46]
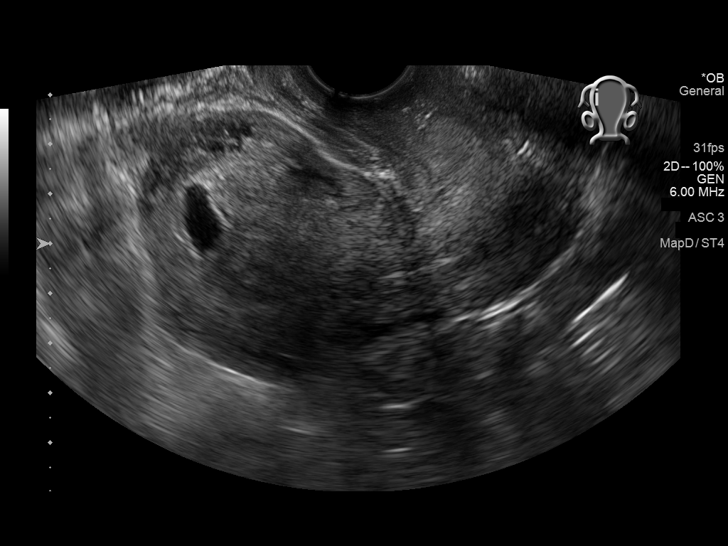
[im 32/46]
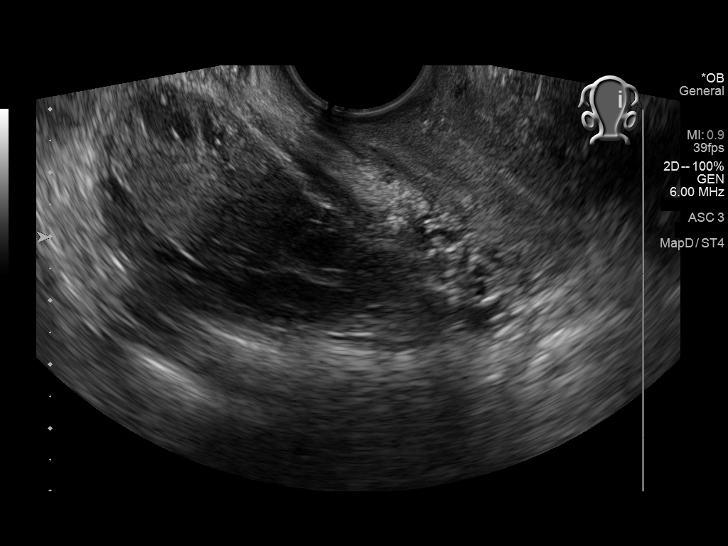
[im 36/46]
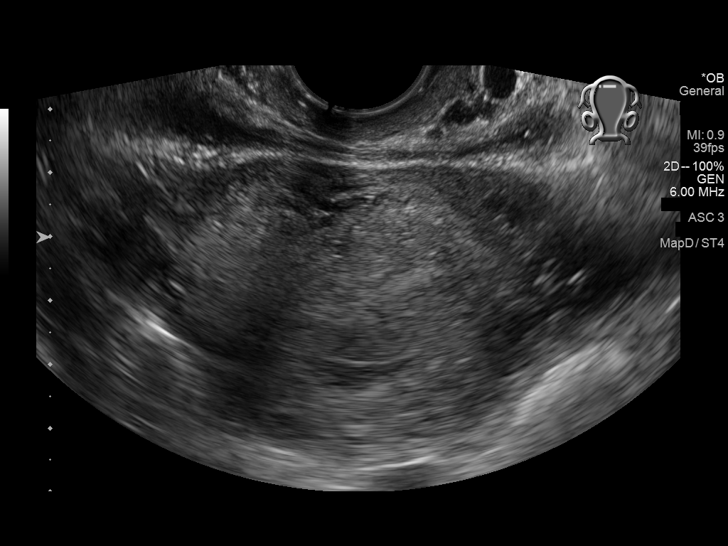
[im 39/46]
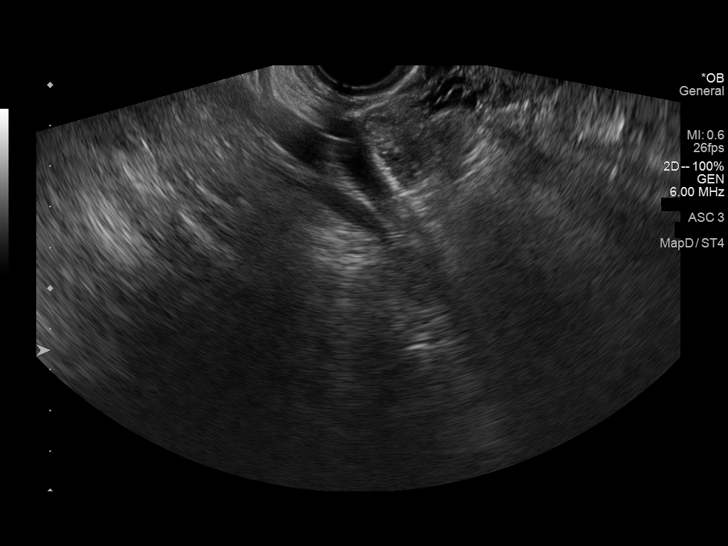
[im 42/46]
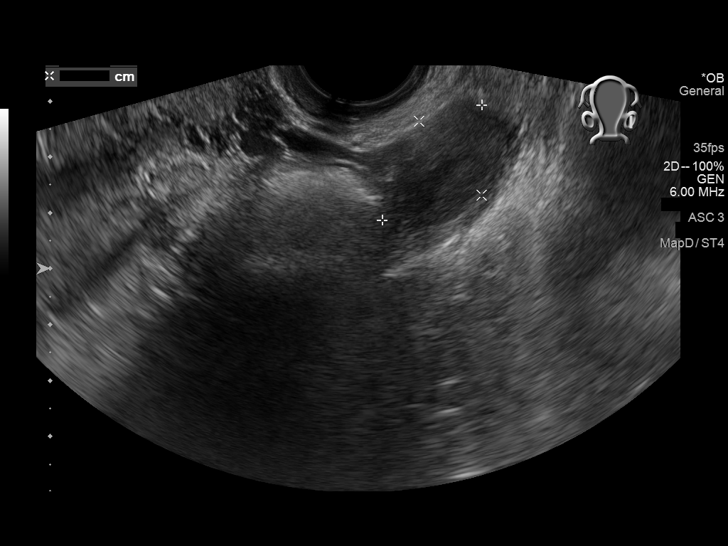
[im 46/46]
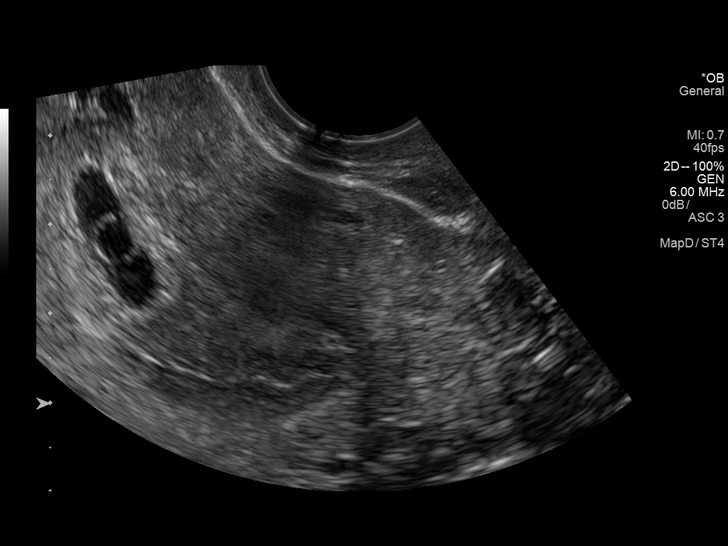

[14 of 28 positions shown; findings below may reference images not displayed]

FINDINGS: Intrauterine gestational sac: Single

Yolk sac:  Present

Embryo:  Not Visualized.

Cardiac Activity: Not Visualized.

MSD: 14.5  mm   6 w   2  d

Subchorionic hemorrhage:  None visualized.

Maternal uterus/adnexae: Normal left ovary. Right ovary is not
visualized. No free fluid in the pelvis.
IMPRESSION: Probable early intrauterine gestational sac and yolk sac but no
fetal pole or cardiac activity yet visualized. Recommend follow-up
quantitative B-HCG levels and follow-up US in 14 days to assess
viability. This recommendation follows SRU consensus guidelines:
Diagnostic Criteria for Nonviable Pregnancy Early in the First
Trimester. N Engl J Med 1359; [DATE].

## 2019-01-27 ENCOUNTER — Encounter (HOSPITAL_COMMUNITY): Payer: Self-pay

## 2019-06-19 IMAGING — US US OB TRANSVAGINAL
1 series · 15 of 28 positions shown · non-contrast
Comparison: None.

CLINICAL DATA: Bleeding

EXAM:
OBSTETRIC <14 WK US AND TRANSVAGINAL OB US
TECHNIQUE: Both transabdominal and transvaginal ultrasound examinations were
performed for complete evaluation of the gestation as well as the
maternal uterus, adnexal regions, and pelvic cul-de-sac.
Transvaginal technique was performed to assess early pregnancy.

[Series 1: us ob transvaginal · 15 of 45 slices shown]
[im 1/45]
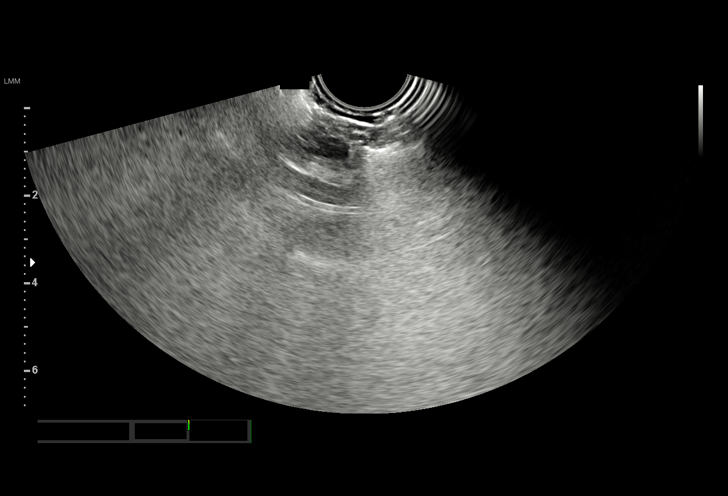
[im 4/45]
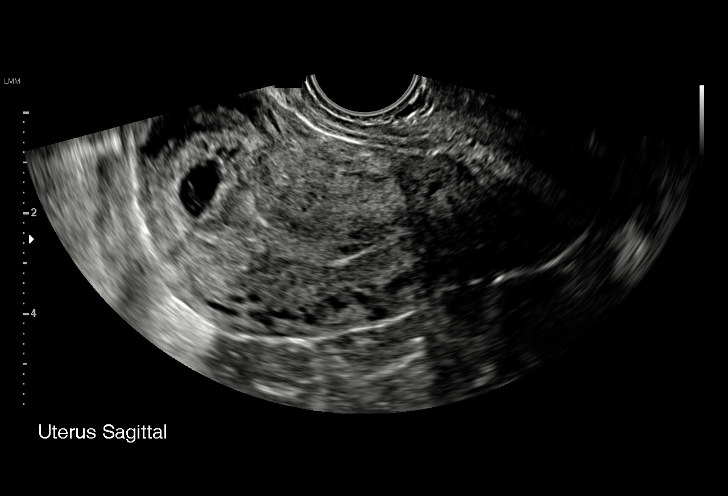
[im 7/45]
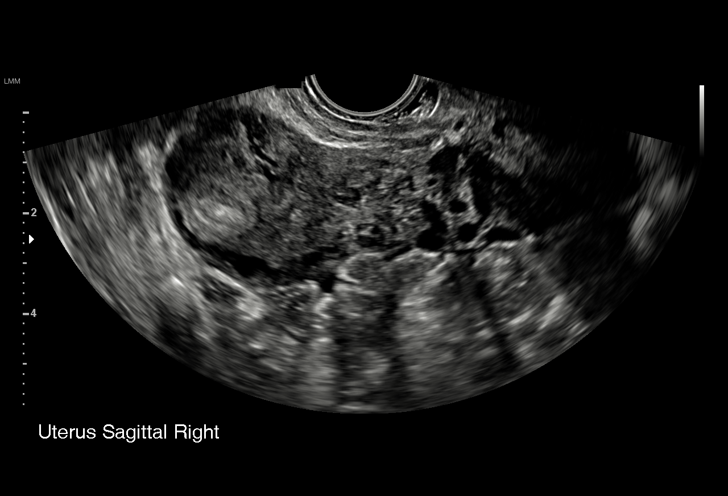
[im 10/45]
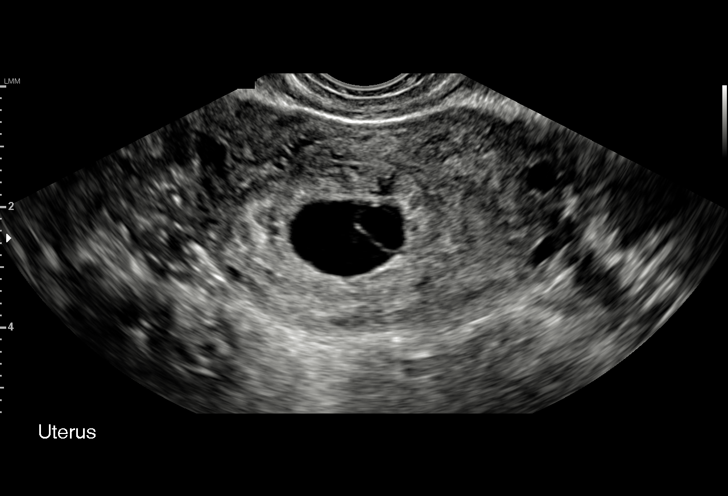
[im 14/45]
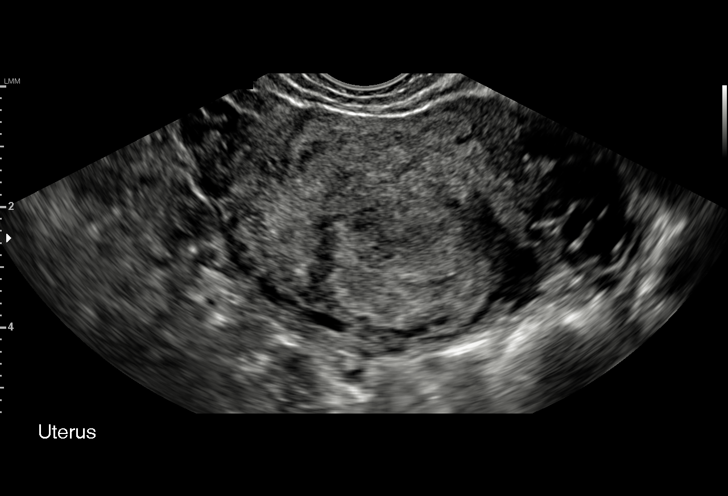
[im 17/45]
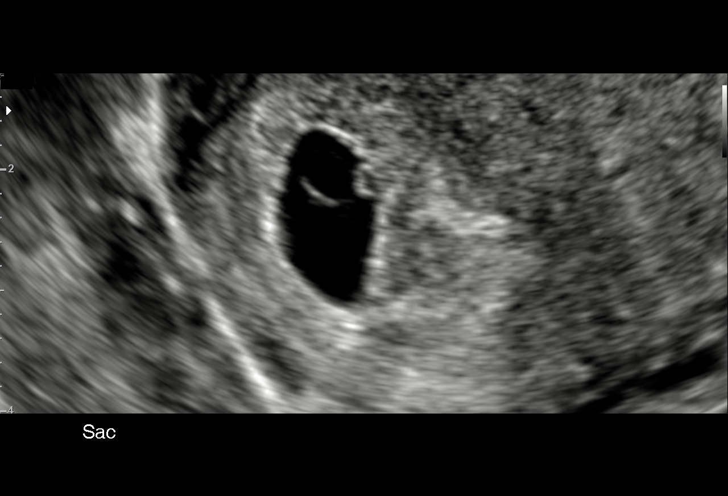
[im 20/45]
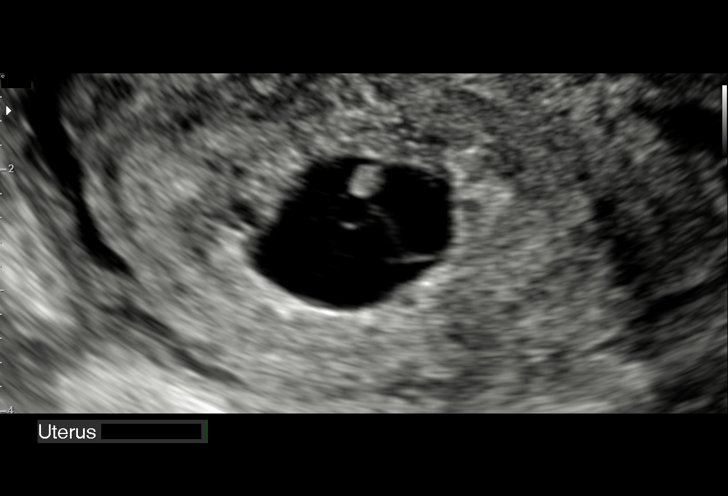
[im 23/45]
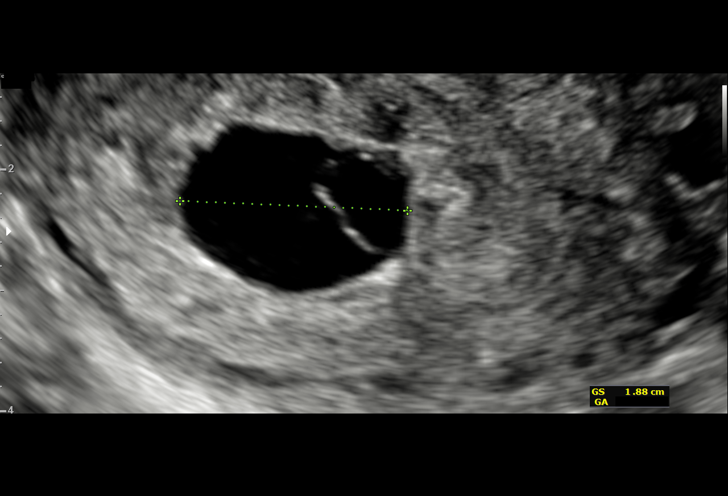
[im 25/45]
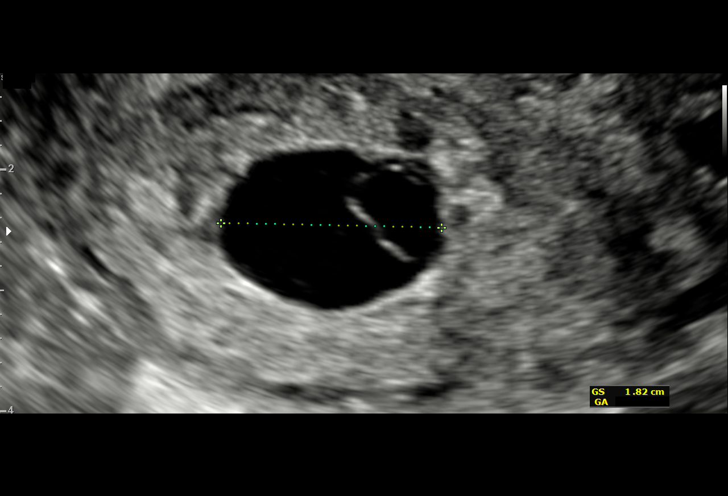
[im 28/45]
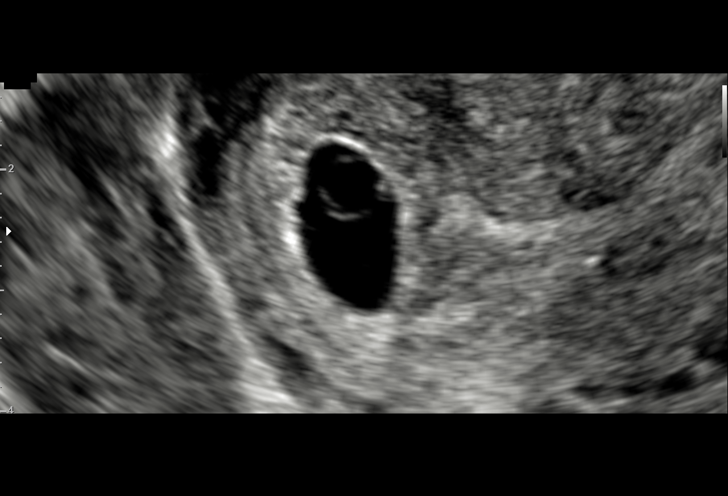
[im 31/45]
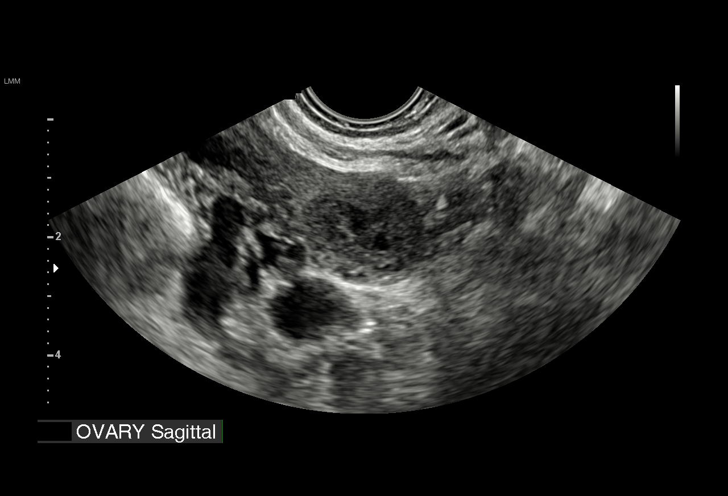
[im 35/45]
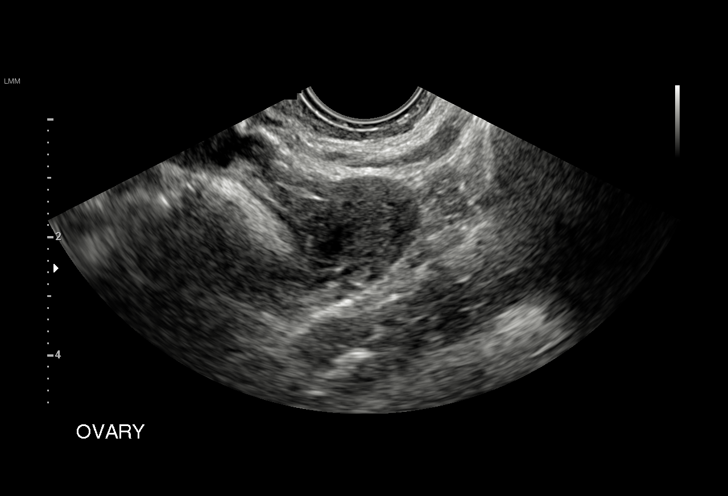
[im 38/45]
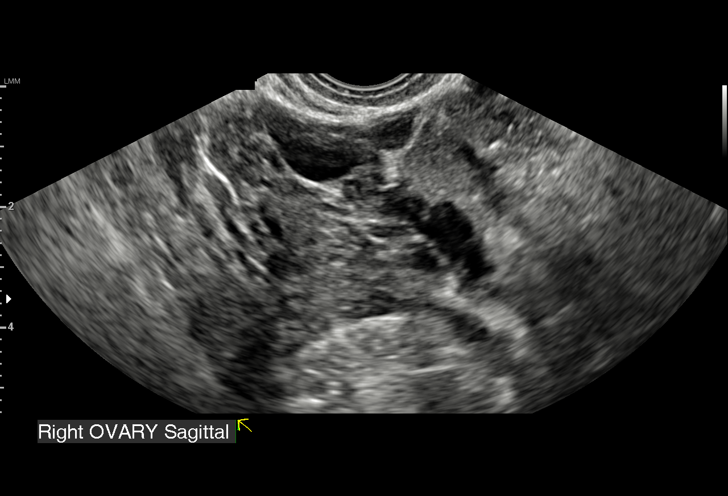
[im 41/45]
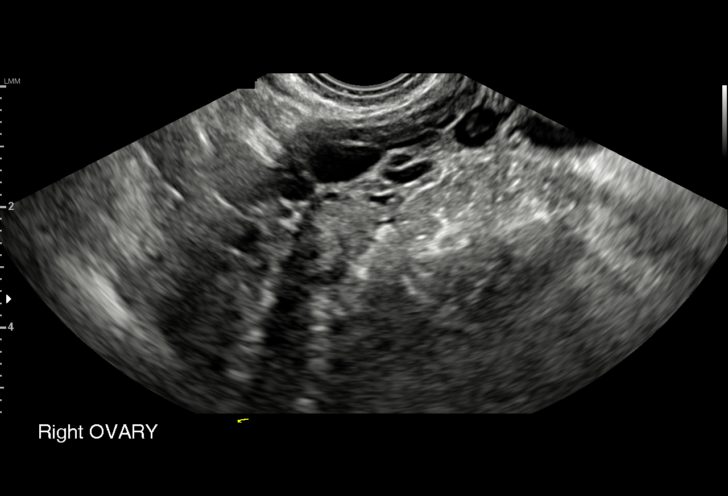
[im 45/45]
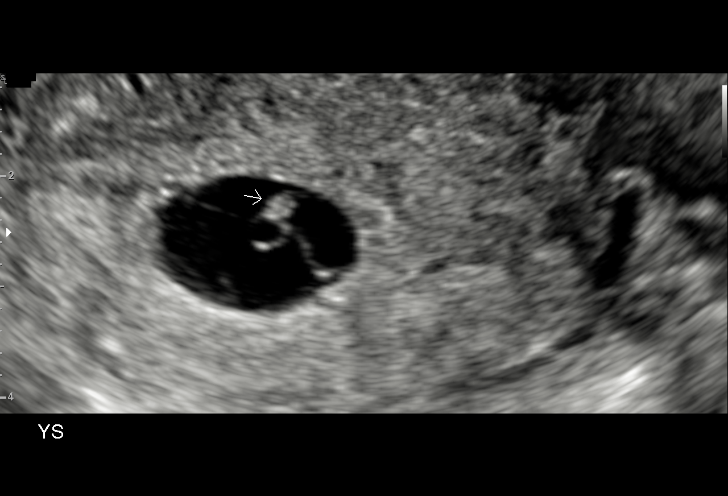

[15 of 28 positions shown; findings below may reference images not displayed]

FINDINGS: Intrauterine gestational sac: Single, somewhat irregular

Yolk sac:  Visualized, collapsed

Embryo:  Not visualized

Cardiac Activity:

Heart Rate:   bpm

MSD: 13 mm   6 w   0 d

CRL:    mm    w    d                  US EDC:

Subchorionic hemorrhage:  None visualized.

Maternal uterus/adnexae: No adnexal mass or free fluid.
IMPRESSION: Somewhat irregular gestational sac and yolk sac. No fetal pole
currently. Estimated gestational age 6 weeks by mean sac diameter.
This could be followed with repeat ultrasound in 10-14 days.

## 2019-06-28 IMAGING — US US OB TRANSVAGINAL
1 series · 15 of 28 positions shown · non-contrast
Comparison: 12/02/2017

CLINICAL DATA: Spontaneous abortion, viability

EXAM:
TRANSVAGINAL OB ULTRASOUND
TECHNIQUE: Transvaginal ultrasound was performed for complete evaluation of the
gestation as well as the maternal uterus, adnexal regions, and
pelvic cul-de-sac.

[Series 1: us ob transvaginal · 52 acquisitions, 15 frames shown]
[im 1/52]
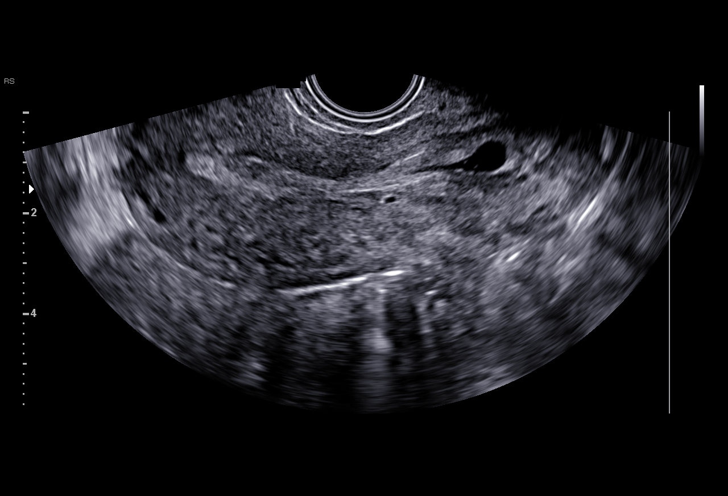
[im 4/52]
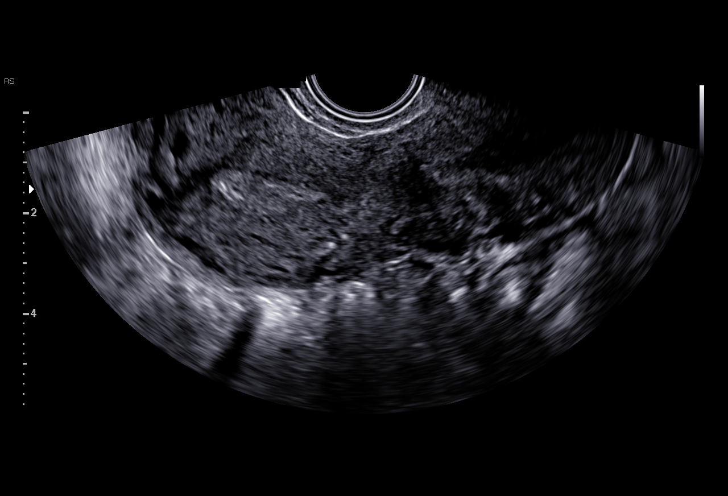
[im 8/52]
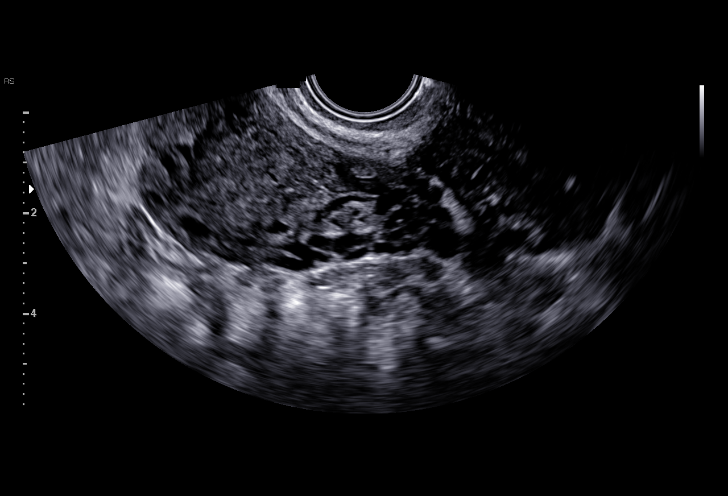
[im 12/52]
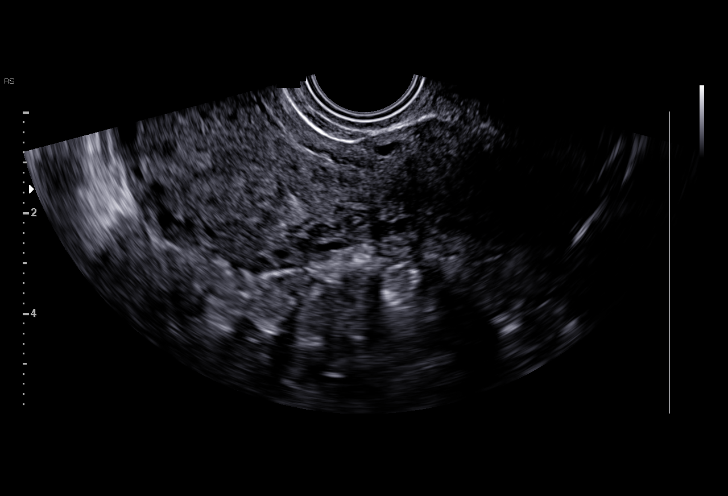
[im 16/52]
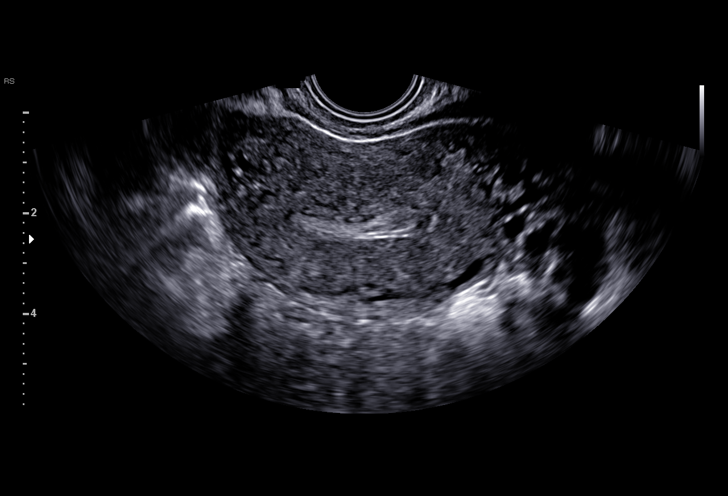
[im 19/52]
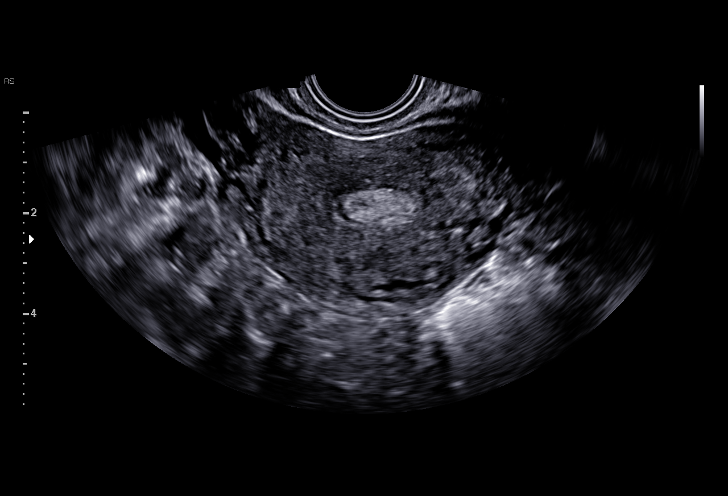
[im 23/52]
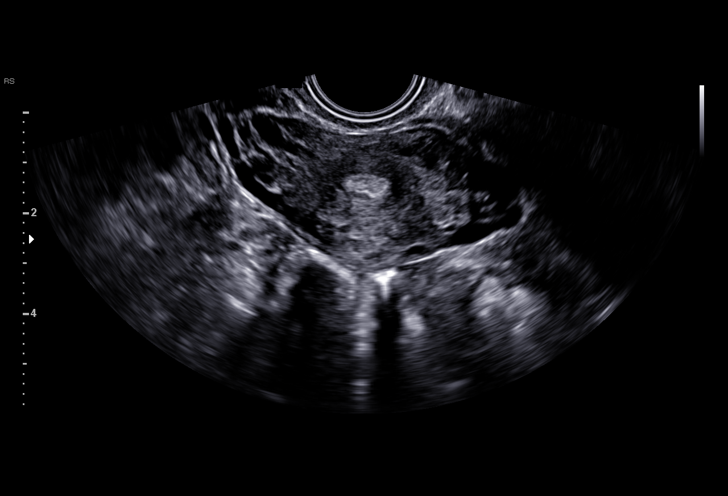
[im 27/52]
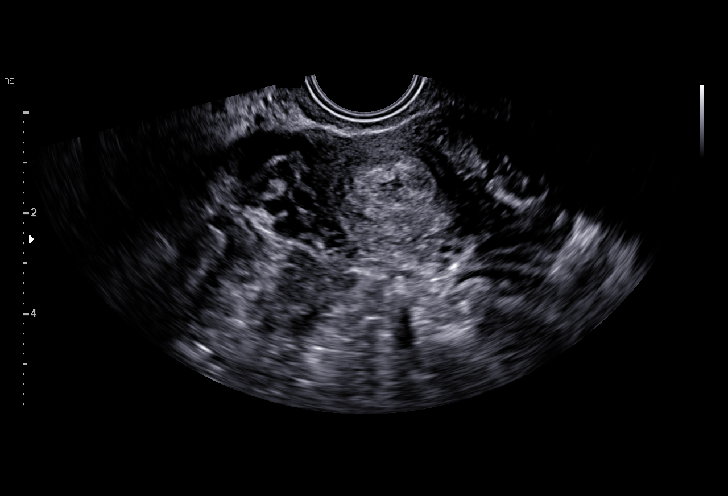
[im 29/52]
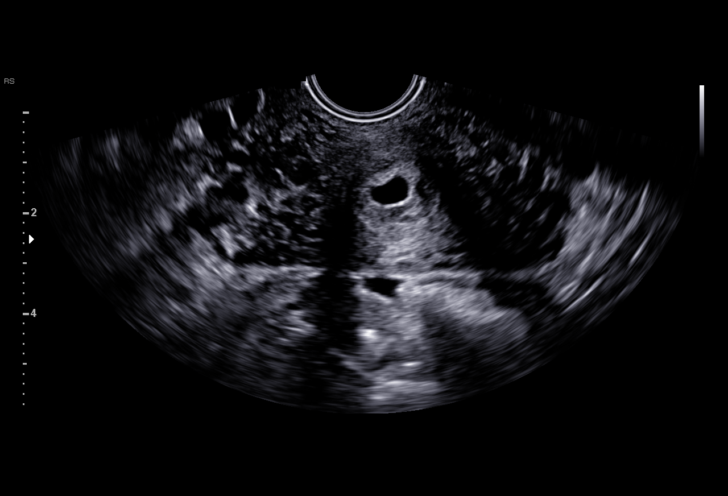
[im 33/52]
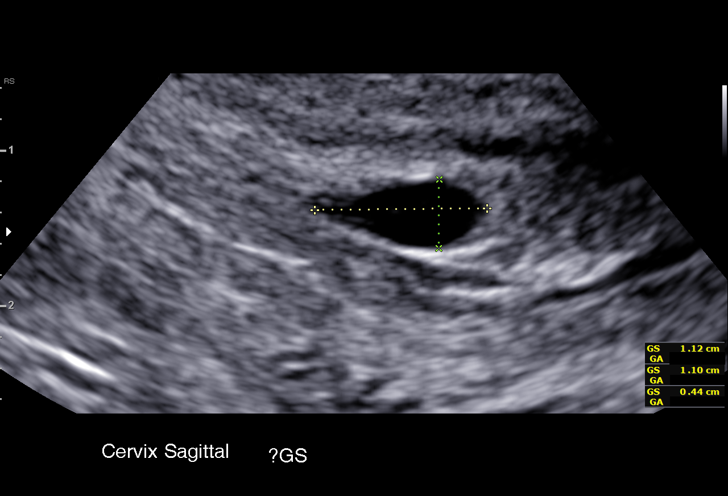
[im 36/52]
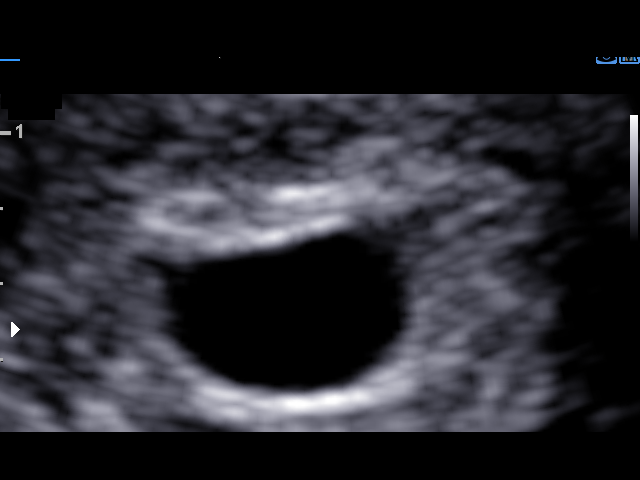
[im 40/52]
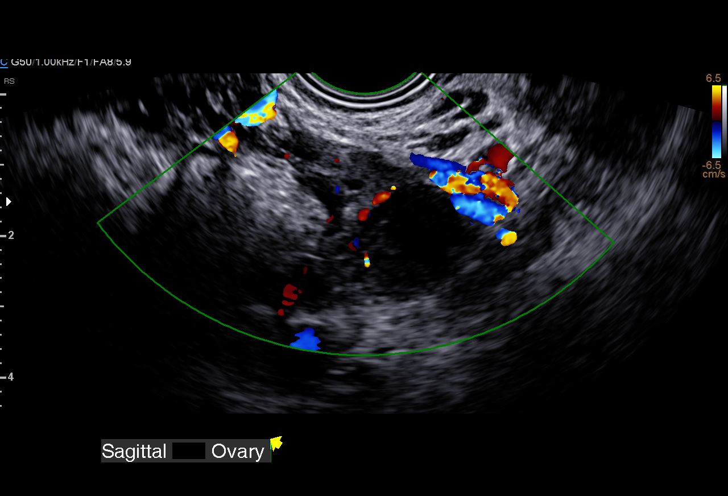
[im 44/52]
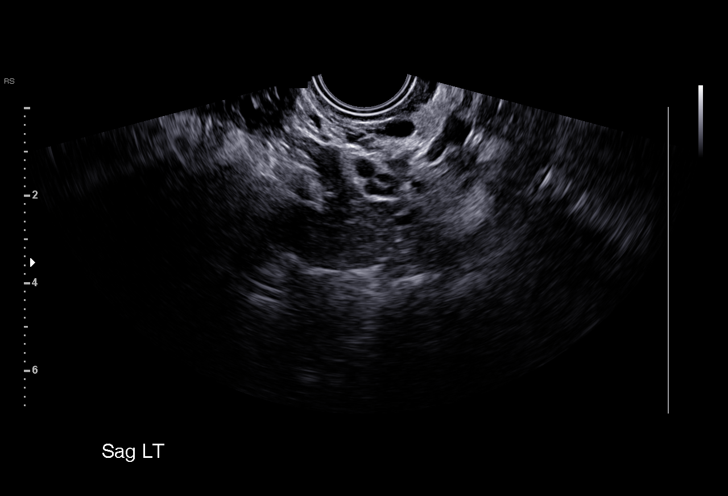
[im 48/52]
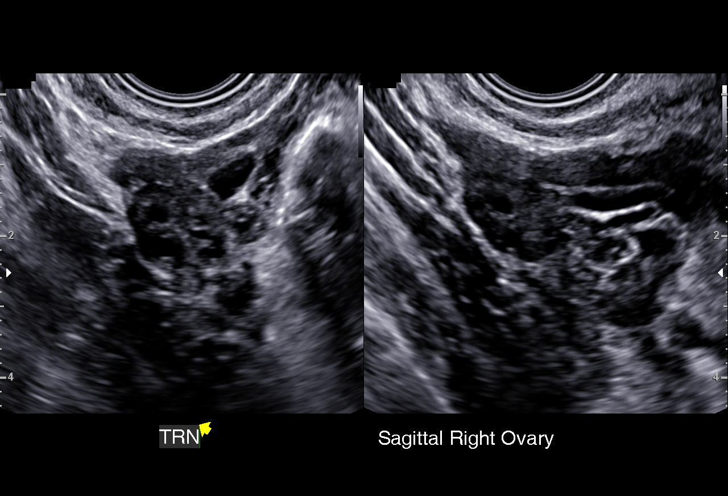
[im 52/52]
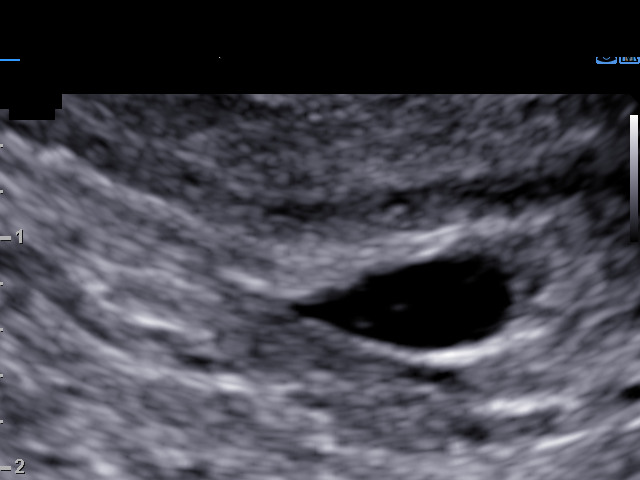

[15 of 28 positions shown; findings below may reference images not displayed]

FINDINGS: Intrauterine gestational sac: Irregular gestational sac in the
region of the cervix

Yolk sac:  Possibly visualized

Embryo:  Not visualized

Cardiac Activity: Not visualized

Heart Rate:  bpm

MSD: 7.8 mm   5 w   3 d

CRL:     mm    w  d                  US EDC:

Subchorionic hemorrhage:  None visualized.

Maternal uterus/adnexae: No adnexal masses or free fluid.
IMPRESSION: Irregular gestational sac, now located in the region of the cervix.
Estimated gestational age 5 weeks 3 days compared with 6 weeks the
previously. Findings compatible with failed pregnancy and
spontaneous abortion in progress.
